# Patient Record
Sex: Male | Born: 1965 | Race: White | Hispanic: No | Smoking: Current every day smoker
Health system: Southern US, Community
[De-identification: ages and names within clinical notes are randomized; demographics above are authoritative.]

## PROBLEM LIST (undated history)

## (undated) DIAGNOSIS — C801 Malignant (primary) neoplasm, unspecified: Secondary | ICD-10-CM

## (undated) DIAGNOSIS — M549 Dorsalgia, unspecified: Secondary | ICD-10-CM

## (undated) DIAGNOSIS — F419 Anxiety disorder, unspecified: Secondary | ICD-10-CM

## (undated) DIAGNOSIS — K7460 Unspecified cirrhosis of liver: Secondary | ICD-10-CM

## (undated) DIAGNOSIS — F200 Paranoid schizophrenia: Secondary | ICD-10-CM

## (undated) DIAGNOSIS — K859 Acute pancreatitis without necrosis or infection, unspecified: Secondary | ICD-10-CM

## (undated) DIAGNOSIS — C229 Malignant neoplasm of liver, not specified as primary or secondary: Secondary | ICD-10-CM

## (undated) DIAGNOSIS — B1920 Unspecified viral hepatitis C without hepatic coma: Secondary | ICD-10-CM

## (undated) HISTORY — PX: TONSILLECTOMY: SUR1361

---

## 2006-01-14 ENCOUNTER — Emergency Department (HOSPITAL_COMMUNITY): Admission: EM | Admit: 2006-01-14 | Discharge: 2006-01-14 | Payer: Self-pay | Admitting: Family Medicine

## 2012-11-22 ENCOUNTER — Emergency Department (HOSPITAL_COMMUNITY)
Admission: EM | Admit: 2012-11-22 | Discharge: 2012-11-22 | Disposition: A | Discharge status: Home / Self Care | Payer: Self-pay | Attending: Emergency Medicine | Admitting: Emergency Medicine

## 2012-11-22 ENCOUNTER — Emergency Department (HOSPITAL_COMMUNITY): Payer: Self-pay

## 2012-11-22 ENCOUNTER — Encounter (HOSPITAL_COMMUNITY): Payer: Self-pay | Admitting: Internal Medicine

## 2012-11-22 DIAGNOSIS — R209 Unspecified disturbances of skin sensation: Secondary | ICD-10-CM | POA: Insufficient documentation

## 2012-11-22 DIAGNOSIS — I10 Essential (primary) hypertension: Secondary | ICD-10-CM | POA: Insufficient documentation

## 2012-11-22 DIAGNOSIS — M549 Dorsalgia, unspecified: Secondary | ICD-10-CM | POA: Insufficient documentation

## 2012-11-22 DIAGNOSIS — G8929 Other chronic pain: Secondary | ICD-10-CM | POA: Insufficient documentation

## 2012-11-22 DIAGNOSIS — R079 Chest pain, unspecified: Secondary | ICD-10-CM | POA: Insufficient documentation

## 2012-11-22 DIAGNOSIS — F17200 Nicotine dependence, unspecified, uncomplicated: Secondary | ICD-10-CM | POA: Insufficient documentation

## 2012-11-22 DIAGNOSIS — M79609 Pain in unspecified limb: Secondary | ICD-10-CM | POA: Insufficient documentation

## 2012-11-22 HISTORY — DX: Dorsalgia, unspecified: M54.9

## 2012-11-22 LAB — COMPREHENSIVE METABOLIC PANEL
Albumin: 3.5 g/dL (ref 3.5–5.2)
BUN: 9 mg/dL (ref 6–23)
Creatinine, Ser: 0.93 mg/dL (ref 0.50–1.35)
Potassium: 4.4 mEq/L (ref 3.5–5.1)
Total Protein: 7.3 g/dL (ref 6.0–8.3)

## 2012-11-22 LAB — D-DIMER, QUANTITATIVE: D-Dimer, Quant: 0.45 ug/mL-FEU (ref 0.00–0.48)

## 2012-11-22 LAB — POCT I-STAT TROPONIN I: Troponin i, poc: 0 ng/mL (ref 0.00–0.08)

## 2012-11-22 LAB — CBC WITH DIFFERENTIAL/PLATELET
Basophils Relative: 0 % (ref 0–1)
Eosinophils Absolute: 0.2 10*3/uL (ref 0.0–0.7)
Hemoglobin: 15 g/dL (ref 13.0–17.0)
MCH: 30.7 pg (ref 26.0–34.0)
MCHC: 34.6 g/dL (ref 30.0–36.0)
Monocytes Absolute: 1 10*3/uL (ref 0.1–1.0)
Monocytes Relative: 13 % — ABNORMAL HIGH (ref 3–12)
Neutrophils Relative %: 65 % (ref 43–77)
RDW: 14.1 % (ref 11.5–15.5)

## 2012-11-22 LAB — POCT I-STAT, CHEM 8
BUN: 8 mg/dL (ref 6–23)
Chloride: 105 mEq/L (ref 96–112)
Sodium: 137 mEq/L (ref 135–145)

## 2012-11-22 MED ORDER — HYDROMORPHONE HCL PF 1 MG/ML IJ SOLN
1.0000 mg | Freq: Once | INTRAMUSCULAR | Status: AC
  Administered 2012-11-22: 1 mg via INTRAVENOUS
  Filled 2012-11-22: qty 1

## 2012-11-22 MED ORDER — OXYCODONE-ACETAMINOPHEN 5-325 MG PO TABS: 1.0000 | ORAL_TABLET | ORAL | Status: DC | PRN

## 2012-11-22 MED ORDER — KETOROLAC TROMETHAMINE 30 MG/ML IJ SOLN: 30.0000 mg | Freq: Once | INTRAMUSCULAR | Status: DC

## 2012-11-22 MED ORDER — GADOBENATE DIMEGLUMINE 529 MG/ML IV SOLN: 20.0000 mL | Freq: Once | INTRAVENOUS | Status: DC

## 2012-11-22 MED ORDER — HYDROMORPHONE HCL PF 2 MG/ML IJ SOLN
2.0000 mg | Freq: Once | INTRAMUSCULAR | Status: AC
  Administered 2012-11-22: 2 mg via INTRAMUSCULAR
  Filled 2012-11-22: qty 1

## 2012-11-22 NOTE — ED Notes (Signed)
Iv nurse at the bedside

## 2012-11-22 NOTE — ED Notes (Signed)
IV team at bedside 

## 2012-11-22 NOTE — ED Notes (Signed)
Pt aware that urine sample is needed.  

## 2012-11-22 NOTE — ED Notes (Signed)
PT refused  Urine test.

## 2012-11-22 NOTE — ED Provider Notes (Signed)
History     CSN: [id number]  Arrival date & time 11/22/12  1347   First MD Initiated Contact with Patient 11/22/12 1347      Chief Complaint  Patient presents with  . Back Pain    (Consider location/radiation/quality/duration/timing/severity/associated sxs/prior treatment) HPI Comments: Rodney Reed is a 47 year old male white male with PMH of chronic back pain and claims to have a ruptured or slipped disc presenting with 3 days of worsening severe back pain.  He was just seen at [facility] for similar complaints and was discharged with referral to specialist and given prescriptions for flexeril, prednisone, and toradol that he did not get filled.  He claims he has taken all three medicines in the past to no relief.  He also claims he has been evaluated by Dr. Lucius Conn from sports medicine in the past and was told of a slipped disc requiring surgery but he cannot afford it. He denies any recent trauma but claims he might have twisted wrong which has set off his pain.  He endorses numbness and tingling in his right toes, right chest pain and shooting pain in left arm earlier that has since then resolved.  He denies any shortness of breath, N/V/D, fever, chills, abdominal pain, or any urinary complaints at this time.  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  He smokes 1 pack per day and drank 2 beers yesterday.  He says he can barely walk or move to the severity of his pain.  The pain is diffuse back pain with point tenderness in mid thoracic and lower lumbar region, exacerbated with any movement, no relieving factors, no relief with vicodin in ED yesterday, worsening in severity, currently 10+, with initial radiation in right chest and shooting pain in left arm that has since then resolved.    Per narcotic database: last filled Vicodin 3 day supply March 30th, 2014 and prior opiate prescriptions of variable strength in January   Patient is a 47 y.o. male  presenting with back pain. The history is provided by the patient. No language interpreter was used.  Back Pain Location:  Generalized Quality:  Stabbing and shooting Radiates to:  L shoulder, R thigh and R foot Pain severity:  Severe Pain is:  Same all the time Onset quality:  Sudden Duration:  3 days Timing:  Constant Progression:  Worsening Chronicity:  Recurrent Context: not falling and not lifting heavy objects   Relieved by:  Nothing Worsened by:  Movement Ineffective treatments:  NSAIDs, narcotics, being still and bed rest Associated symptoms: chest pain, leg pain, numbness, paresthesias and tingling   Associated symptoms comment:  Right sided chest pain, left arm initially with shooting pain Risk factors comment:  Drug abuse, hx of heroin use   Past Medical History  Diagnosis Date  . Back pain     History reviewed. No pertinent past surgical history.  History reviewed. No pertinent family history.  History  Substance Use Topics  . Smoking status: Current Every Day Smoker -- 1.00 packs/day  . Smokeless tobacco: Not on file  . Alcohol Use: 1.2 oz/week    2 Cans of beer per week     Comment: last use 11/21/12    Review of Systems  Constitutional: Negative.   HENT: Negative.   Eyes: Negative.   Respiratory: Negative.   Cardiovascular: Positive for chest pain.  Gastrointestinal: Negative.   Endocrine: Negative.   Genitourinary: Negative.   Musculoskeletal:  Positive for back pain.  Skin: Negative.   Allergic/Immunologic: Negative.   Neurological: Positive for tingling, numbness and paresthesias.       Paresthesias right toes  Hematological: Negative.   Psychiatric/Behavioral: Negative.     Allergies  Review of patient's allergies indicates no known allergies.  Home Medications   Current Outpatient Rx  Name  Route  Sig  Dispense  Refill  . oxyCODONE-acetaminophen (PERCOCET/ROXICET) 5-325 MG per tablet   Oral   Take 1 tablet by mouth every 4 (four)  hours as needed for pain.   15 tablet   0     BP 150/80  Pulse 88  Temp(Src) 98.7 F (37.1 C) (Oral)  Resp 20  SpO2 97%  Physical Exam  Constitutional: He is oriented to person, place, and time. He appears well-developed and well-nourished.  Acute distress due to back pain  HENT:  Head: Normocephalic and atraumatic.  Eyes: EOM are normal. Pupils are equal, round, and reactive to light.  Neck: Normal range of motion. Neck supple.  Cardiovascular: Normal rate, regular rhythm, normal heart sounds and intact distal pulses.   Equal pulses b/l  Pulmonary/Chest: Effort normal and breath sounds normal.  Abdominal: Soft. Bowel sounds are normal. There is no tenderness.  Musculoskeletal: He exhibits tenderness. He exhibits no edema.  Diffuse tenderness to palpation of back, point tenderness mid thoracic spine and lower lumbar at midline.  Extreme complaints of pain with any movement.  Minimal ability to raise b/l legs due to pain.   Neurological: He is alert and oriented to person, place, and time.  Strength equal b/l upper and lower extremities.  Sensation grossly intact  Skin: Skin is warm and dry. There is erythema.  Psychiatric: His behavior is normal.  In excruciating pain    ED Course  Procedures (including critical care time)  Labs Reviewed  CBC WITH DIFFERENTIAL - Abnormal; Notable for the following:    Platelets 80 (*)    Monocytes Relative 13 (*)    All other components within normal limits  COMPREHENSIVE METABOLIC PANEL - Abnormal; Notable for the following:    Glucose, Bld 104 (*)    AST 57 (*)    ALT 60 (*)    All other components within normal limits  POCT I-STAT, CHEM 8 - Abnormal; Notable for the following:    Glucose, Bld 102 (*)    All other components within normal limits  D-DIMER, QUANTITATIVE  POCT I-STAT TROPONIN I   Rodney Reed  11/22/2012   *RADIOLOGY REPORT*  Clinical Data:  Fevers and sweats.  Severe back pain.  MRI THORACIC AND  LUMBAR SPINE WITHOUT Reed  Technique:  Multiplanar and multiecho pulse sequences of the thoracic and lumbar spine were obtained without intravenous Reed.  Comparison:  Chest x-ray 11/22/2012.  MRI THORACIC SPINE  Findings: Normal signals present in the thoracic spinal cord. Schmorl's nodes are present at multiple disc levels from T5-6 through T11-12.  There is some chronic loss of vertebral body height at T7-T10 with exaggerated thoracic kyphosis.  Slight disc bulges are present at T5-6, T6-7, and T7-8 without significant stenoses.  The foramina are patent bilaterally.  The soft tissues are unremarkable.  Disc signal is within normal limits.  IMPRESSION:  1.  Mild degenerative changes of the thoracic spine. 2.  Slight exaggerated kyphosis. 3.  No acute or focal abnormality to explain the patient's symptoms.  MRI LUMBAR SPINE  Findings: Normal signal is present in the conus medullaris which terminates  at T12-L1.  Marrow signal, vertebral body heights, and alignment are normal.  Slight leftward curvature of the lumbar spine is centered at L2-3.  Relatively short pedicles are present.  L1-2:  Mild facet hypertrophy is present.  Slight disc bulging is noted.  There is no significant stenosis.  L2-3:  Mild facet hypertrophy is present bilaterally.  Slight disc bulging is evident.  No significant stenosis is present.  L3-4:  A mild broad-based disc herniation is present.  Mild facet hypertrophy is noted bilaterally.  Mild foraminal narrowing is present on the left.  L4-5:  A broad-based disc herniation is asymmetric to the right. Mild facet hypertrophy is noted.  Mild lateral recess and foraminal narrowing is present bilaterally.  L5-S1:  Mild facet hypertrophy is present.  There is no significant stenosis.  IMPRESSION:  1.  Congenital and acquired stenosis as described. 2.  The most severe level is L4-5 with mild bilateral lateral recess and foraminal stenosis, worse on the right. 3.  Mild left foraminal stenosis  at L3-4. 4.  Multilevel facet hypertrophy as described.   Original Report Authenticated By: Marin Roberts, M.D.   Rodney Reed  11/22/2012   *RADIOLOGY REPORT*  Clinical Data:  Fevers and sweats.  Severe back pain.  MRI THORACIC AND LUMBAR SPINE WITHOUT Reed  Technique:  Multiplanar and multiecho pulse sequences of the thoracic and lumbar spine were obtained without intravenous Reed.  Comparison:  Chest x-ray 11/22/2012.  MRI THORACIC SPINE  Findings: Normal signals present in the thoracic spinal cord. Schmorl's nodes are present at multiple disc levels from T5-6 through T11-12.  There is some chronic loss of vertebral body height at T7-T10 with exaggerated thoracic kyphosis.  Slight disc bulges are present at T5-6, T6-7, and T7-8 without significant stenoses.  The foramina are patent bilaterally.  The soft tissues are unremarkable.  Disc signal is within normal limits.  IMPRESSION:  1.  Mild degenerative changes of the thoracic spine. 2.  Slight exaggerated kyphosis. 3.  No acute or focal abnormality to explain the patient's symptoms.  MRI LUMBAR SPINE  Findings: Normal signal is present in the conus medullaris which terminates at T12-L1.  Marrow signal, vertebral body heights, and alignment are normal.  Slight leftward curvature of the lumbar spine is centered at L2-3.  Relatively short pedicles are present.  L1-2:  Mild facet hypertrophy is present.  Slight disc bulging is noted.  There is no significant stenosis.  L2-3:  Mild facet hypertrophy is present bilaterally.  Slight disc bulging is evident.  No significant stenosis is present.  L3-4:  A mild broad-based disc herniation is present.  Mild facet hypertrophy is noted bilaterally.  Mild foraminal narrowing is present on the left.  L4-5:  A broad-based disc herniation is asymmetric to the right. Mild facet hypertrophy is noted.  Mild lateral recess and foraminal narrowing is present bilaterally.  L5-S1:  Mild facet hypertrophy  is present.  There is no significant stenosis.  IMPRESSION:  1.  Congenital and acquired stenosis as described. 2.  The most severe level is L4-5 with mild bilateral lateral recess and foraminal stenosis, worse on the right. 3.  Mild left foraminal stenosis at L3-4. 4.  Multilevel facet hypertrophy as described.   Original Report Authenticated By: Marin Roberts, M.D.   Dg Chest Portable 1 View  11/22/2012   *RADIOLOGY REPORT*  Clinical Data: Numbness and tingling.  PORTABLE CHEST - 1 VIEW  Comparison: 04/23/2010  Findings: Patient rotated minimally left. Midline  trachea.  Normal heart size for level of inspiration.  Mild left hemidiaphragm elevation which is mildly increased since the 2011 study.  No right and no definite left pleural effusion. No pneumothorax.  Mildly low lung volumes.  Volume loss at the left lung base with overlying pulmonary opacity; likely atelectasis.  IMPRESSION: Diminished lung volumes with increased mild left hemidiaphragm elevation.  No convincing evidence of acute disease.  If there is a clinical concern of left lower lobe acute process, consider PA and lateral radiographs.   Original Report Authenticated By: Jeronimo Greaves, M.D.    1. Back pain   2. HTN (hypertension)      Date: 11/22/2012  Rate: 81bpm  Rhythm: normal sinus rhythm  QRS Axis: normal  Intervals: normal  ST/T Wave abnormalities: normal  Conduction Disutrbances:none  Narrative Interpretation: NSR 81bpm, ? Left atrial abnormality  Old EKG Reviewed: none available  MDM  Rodney Reed is a 47 year old male with chronic back pain presenting with worsening diffuse back pain x3 days. Pain is 10+, hypertensive, with hx of IVDA and other illicit drug use.  Concern for possible spinal abscess given degree of pain and drug abuse hx vs. Disc herniation vs. Aortic dissection vs. ACS among broad differentials.   -CBC: no leukocytosis, Hb 15 -CMET: mild transaminitis: AST 57 ALT 60 -Troponin x1 negative  -EKG:  NST with no ST or T wave changes suggestive of ischemia -B/L blood pressure: R 129/94 L 141/84  -MRI thoracic and lumbar spine w/wo Reed: mild degenerative changes of thoracic spine, slight exaggerated kyphosis, no acute or focal abnormality.  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  -u/a--unable to be collected -UDS--unable to be collected -D-Dimer: 0.45 -Dilaudid 2mg  IM x1  Case discussed with Dr. Manus Gunning and patient was discharged by Dr. Bebe Shaggy.  Please refer to their notes.          Darden Palmer, MD 11/23/12 [phone]

## 2012-11-22 NOTE — ED Notes (Signed)
Per [facility], pt was seen at hospital yesterday for same. Back pain for 3 days and was referred to specialist but has not followed up.

## 2012-11-22 NOTE — ED Notes (Signed)
Pt speaking to ED-P in harsh, rapid, pressured tone. Reports he was seen in ED at [facility] yesterday and given rx for prednisone, toradol and flexeril that he did not get filled "because it doesn't help" "I need pain medicine". Reports he has seen a "back doctor" that told him he needs surgery and that he also has been referred to pain clinic.

## 2012-11-22 NOTE — ED Provider Notes (Signed)
Seen with resident. 3 days of lower back pain, worse with movement, radiating down R leg with tingling in toes.  Hx ruptured disc, not yet seen specialist.  Seen at [facility] yesterday where "nothing" was done.. Denies chest pain, SOB, abdominal pain. No incontinence, vomiting, fever.  Remote history of IVDA.  TTP throughout lumbar spine and paraspinal muscles.  TTP about T7. Equal grip strengths. Equal peripheral pulses, equal bilateral upper extremity blood pressures. 5/5 strength in bilateral lower extremities. Ankle plantar and dorsiflexion intact. Great toe extension intact bilaterally. +2 DP and PT pulses. +2 patellar reflexes bilaterally. Normal gait. BP 150/80  Pulse 88  Temp(Src) 98.7 F (37.1 C) (Oral)  Resp 20  SpO2 97%    Glynn Octave, MD 11/22/12 2104

## 2012-11-22 NOTE — ED Notes (Signed)
Unable to gain IV access by RN x2 and EMS x2. ED-P notified and IV team paged.

## 2012-11-22 NOTE — ED Provider Notes (Signed)
I assumed care in signout to f/u on MR imaging Imaging results reviewed I spoke to dr Newell Coral, on for neurosurgery He reviewed imaging and he does not need admission or neurosurgery followup Pt is able to stand up and ambulate unassisted when I am in the room Distal pulses are intact.  He is able to dorsi/plantar flex both feet He has no abdominal tenderness He does not mention any recurrent chest pain, EKG reviewed and unremarkable Advised need for f/u for his back pain and his HTN He may need f/u with pain clinic as well   Joya Gaskins, MD 11/22/12 1926

## 2012-11-23 NOTE — ED Provider Notes (Signed)
I saw and evaluated the patient, reviewed the resident's note and I agree with the findings and plan. If applicable, I agree with the resident's interpretation of the EKG.  If applicable, I was present for critical portions of any procedures performed.   See my additional note  Glynn Octave, MD 11/23/12 [phone]

## 2012-12-04 ENCOUNTER — Emergency Department (HOSPITAL_COMMUNITY)
Admission: EM | Admit: 2012-12-04 | Discharge: 2012-12-04 | Disposition: A | Discharge status: Home / Self Care | Payer: Self-pay | Attending: Emergency Medicine | Admitting: Emergency Medicine

## 2012-12-04 ENCOUNTER — Encounter (HOSPITAL_COMMUNITY): Payer: Self-pay

## 2012-12-04 ENCOUNTER — Emergency Department (HOSPITAL_COMMUNITY): Payer: Self-pay

## 2012-12-04 DIAGNOSIS — R000 Tachycardia, unspecified: Secondary | ICD-10-CM | POA: Insufficient documentation

## 2012-12-04 DIAGNOSIS — R109 Unspecified abdominal pain: Secondary | ICD-10-CM | POA: Insufficient documentation

## 2012-12-04 DIAGNOSIS — M549 Dorsalgia, unspecified: Secondary | ICD-10-CM

## 2012-12-04 DIAGNOSIS — Y92410 Unspecified street and highway as the place of occurrence of the external cause: Secondary | ICD-10-CM | POA: Insufficient documentation

## 2012-12-04 DIAGNOSIS — Y998 Other external cause status: Secondary | ICD-10-CM | POA: Insufficient documentation

## 2012-12-04 DIAGNOSIS — F17200 Nicotine dependence, unspecified, uncomplicated: Secondary | ICD-10-CM | POA: Insufficient documentation

## 2012-12-04 DIAGNOSIS — N390 Urinary tract infection, site not specified: Secondary | ICD-10-CM

## 2012-12-04 DIAGNOSIS — K769 Liver disease, unspecified: Secondary | ICD-10-CM

## 2012-12-04 DIAGNOSIS — R300 Dysuria: Secondary | ICD-10-CM | POA: Insufficient documentation

## 2012-12-04 DIAGNOSIS — G8929 Other chronic pain: Secondary | ICD-10-CM | POA: Insufficient documentation

## 2012-12-04 LAB — CBC WITH DIFFERENTIAL/PLATELET
Basophils Absolute: 0.1 10*3/uL (ref 0.0–0.1)
Basophils Relative: 0 % (ref 0–1)
Eosinophils Relative: 2 % (ref 0–5)
Lymphocytes Relative: 11 % — ABNORMAL LOW (ref 12–46)
MCHC: 36 g/dL (ref 30.0–36.0)
MCV: 87.2 fL (ref 78.0–100.0)
Monocytes Absolute: 1.3 10*3/uL — ABNORMAL HIGH (ref 0.1–1.0)
Platelets: 188 10*3/uL (ref 150–400)
RDW: 13.3 % (ref 11.5–15.5)
WBC: 16.4 10*3/uL — ABNORMAL HIGH (ref 4.0–10.5)

## 2012-12-04 LAB — POCT I-STAT, CHEM 8
Calcium, Ion: 1.14 mmol/L (ref 1.12–1.23)
Chloride: 110 mEq/L (ref 96–112)
Glucose, Bld: 96 mg/dL (ref 70–99)
HCT: 52 % (ref 39.0–52.0)
Hemoglobin: 17.7 g/dL — ABNORMAL HIGH (ref 13.0–17.0)
TCO2: 23 mmol/L (ref 0–100)

## 2012-12-04 LAB — URINALYSIS, ROUTINE W REFLEX MICROSCOPIC
Ketones, ur: 15 mg/dL — AB
Nitrite: NEGATIVE
Protein, ur: NEGATIVE mg/dL
Urobilinogen, UA: 2 mg/dL — ABNORMAL HIGH (ref 0.0–1.0)
pH: 5.5 (ref 5.0–8.0)

## 2012-12-04 LAB — URINE MICROSCOPIC-ADD ON

## 2012-12-04 MED ORDER — CIPROFLOXACIN HCL 500 MG PO TABS
500.0000 mg | ORAL_TABLET | Freq: Once | ORAL | Status: AC
  Administered 2012-12-04: 500 mg via ORAL
  Filled 2012-12-04: qty 1

## 2012-12-04 MED ORDER — IOHEXOL 300 MG/ML  SOLN: 25.0000 mL | INTRAMUSCULAR | Status: AC

## 2012-12-04 MED ORDER — HYDROMORPHONE HCL PF 1 MG/ML IJ SOLN
1.0000 mg | Freq: Once | INTRAMUSCULAR | Status: AC
  Administered 2012-12-04: 1 mg via INTRAVENOUS
  Filled 2012-12-04: qty 1

## 2012-12-04 MED ORDER — OXYCODONE-ACETAMINOPHEN 5-325 MG PO TABS
1.0000 | ORAL_TABLET | Freq: Once | ORAL | Status: AC
  Administered 2012-12-04: 1 via ORAL
  Filled 2012-12-04: qty 1

## 2012-12-04 MED ORDER — CIPROFLOXACIN HCL 500 MG PO TABS: 500.0000 mg | ORAL_TABLET | Freq: Two times a day (BID) | ORAL | Status: DC

## 2012-12-04 MED ORDER — DIAZEPAM 5 MG PO TABS
10.0000 mg | ORAL_TABLET | Freq: Once | ORAL | Status: AC
  Administered 2012-12-04: 10 mg via ORAL
  Filled 2012-12-04: qty 2

## 2012-12-04 MED ORDER — SODIUM CHLORIDE 0.9 % IV SOLN
Freq: Once | INTRAVENOUS | Status: AC
  Administered 2012-12-04: 18:00:00 via INTRAVENOUS

## 2012-12-04 MED ORDER — IOHEXOL 300 MG/ML  SOLN
120.0000 mL | Freq: Once | INTRAMUSCULAR | Status: AC | PRN
  Administered 2012-12-04: 120 mL via INTRAVENOUS

## 2012-12-04 MED ORDER — OXYCODONE-ACETAMINOPHEN 5-325 MG PO TABS: 1.0000 | ORAL_TABLET | Freq: Four times a day (QID) | ORAL | Status: DC | PRN

## 2012-12-04 MED ORDER — ONDANSETRON HCL 4 MG/2ML IJ SOLN
4.0000 mg | Freq: Once | INTRAMUSCULAR | Status: AC
  Administered 2012-12-04: 4 mg via INTRAVENOUS
  Filled 2012-12-04: qty 2

## 2012-12-04 NOTE — ED Notes (Signed)
CT contacted about wait time. Per CT pt is next to be seen. Pt made aware. Pt reports increase in pain. MD Plunkett made aware.
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
## 2012-12-04 NOTE — ED Notes (Signed)
CT contacted about making disc of pt's results for him.

## 2012-12-04 NOTE — ED Notes (Signed)
Rodney Reed in radiology called and states that pt does not need oral contrast.

## 2013-04-14 ENCOUNTER — Emergency Department (HOSPITAL_COMMUNITY)
Admission: EM | Admit: 2013-04-14 | Discharge: 2013-04-15 | Disposition: A | Discharge status: Home / Self Care | Payer: Medicaid Other | Attending: Emergency Medicine | Admitting: Emergency Medicine

## 2013-04-14 ENCOUNTER — Emergency Department (HOSPITAL_COMMUNITY): Payer: Medicaid Other

## 2013-04-14 ENCOUNTER — Encounter (HOSPITAL_COMMUNITY): Payer: Self-pay | Admitting: *Deleted

## 2013-04-14 DIAGNOSIS — R269 Unspecified abnormalities of gait and mobility: Secondary | ICD-10-CM | POA: Insufficient documentation

## 2013-04-14 DIAGNOSIS — IMO0002 Reserved for concepts with insufficient information to code with codable children: Secondary | ICD-10-CM | POA: Insufficient documentation

## 2013-04-14 DIAGNOSIS — Y93E6 Activity, residential relocation: Secondary | ICD-10-CM | POA: Insufficient documentation

## 2013-04-14 DIAGNOSIS — F17200 Nicotine dependence, unspecified, uncomplicated: Secondary | ICD-10-CM | POA: Insufficient documentation

## 2013-04-14 DIAGNOSIS — Y929 Unspecified place or not applicable: Secondary | ICD-10-CM | POA: Insufficient documentation

## 2013-04-14 DIAGNOSIS — M549 Dorsalgia, unspecified: Secondary | ICD-10-CM

## 2013-04-14 DIAGNOSIS — W108XXA Fall (on) (from) other stairs and steps, initial encounter: Secondary | ICD-10-CM | POA: Insufficient documentation

## 2013-04-14 MED ORDER — ONDANSETRON 4 MG PO TBDP
8.0000 mg | ORAL_TABLET | Freq: Once | ORAL | Status: AC
  Administered 2013-04-14: 8 mg via ORAL
  Filled 2013-04-14: qty 2

## 2013-04-14 MED ORDER — OXYCODONE-ACETAMINOPHEN 5-325 MG PO TABS
2.0000 | ORAL_TABLET | Freq: Once | ORAL | Status: AC
  Administered 2013-04-14: 2 via ORAL
  Filled 2013-04-14: qty 2

## 2013-04-14 NOTE — ED Notes (Signed)
Pt states he was carrying a box down some stairs and missed the step and fell onto his left arm, and slid down the last two steps on his back. Pt states he has back issues, and is having a shooting burning sensation down the center of the back, around his abd, and the upper part of his back to his left shoulder. Pt states he is also having some numbness and tingling to his toes. Pt states he moved recently and moved back and was unable to see his regular doctor. Pt rates pain 8/10. Pt states he ran out of his pain medication prescription and has been unable to get it filled due to switching doctors and moving.

## 2013-04-14 NOTE — ED Notes (Signed)
Pt states carrying box from moving and fell down 5 steps. Pt landed on his left shoulder and then his back. Pt has back problems to begin with, pt ambulatory.

## 2013-04-14 NOTE — ED Provider Notes (Signed)
CSN: [id number]     Arrival date & time 04/14/13  1921 History  This chart was scribed for non-physician practitioner, Mora Bellman, PA-C,working with Hurman Horn, MD, by Karle Plumber, ED Scribe.  This patient was seen in room TR11C/TR11C and the patient's care was started at 9:17 PM.    Chief Complaint  Patient presents with  . Fall   The history is provided by the patient. No language interpreter was used.   HPI Comments:  Rodney Reed is a 47 y.o. male with h/o back problems who presents to the Emergency Department complaining of severe, sharp, shooting right-sided back pain that radiates down to his lower back onset earlier today. He states he was moving some things when he missed a step and fell down the remaining 3-4 stairs. He reports landing on his left shoulder and twisted rolling to his back. He has a long history of chronic back pain and is managed in the community. Pt states he feels tingling in his toes and "needles" in his feet. He denies h/o cancer or h/o IV drug use. Pt denies bowel or bladder incontinence, head injury or LOC. No fevers, chills, nausea, vomiting, abdominal pain.    Past Medical History  Diagnosis Date  . Back pain    History reviewed. No pertinent past surgical history. History reviewed. No pertinent family history. History  Substance Use Topics  . Smoking status: Current Every Day Smoker -- 1.00 packs/day  . Smokeless tobacco: Not on file  . Alcohol Use: 1.2 oz/week    2 Cans of beer per week     Comment: last use 11/21/12    Review of Systems  Constitutional: Negative for fever and chills.  Respiratory: Negative for shortness of breath.   Gastrointestinal:       Denies bowel incontinence.  Genitourinary:       Denies bladder incontinence.  Musculoskeletal: Positive for back pain, arthralgias and gait problem.  Neurological: Negative for syncope.  All other systems reviewed and are negative.   Allergies  Review of patient's  allergies indicates no known allergies.  Home Medications   Current Outpatient Rx  Name  Route  Sig  Dispense  Refill  . ciprofloxacin (CIPRO) 500 MG tablet   Oral   Take 1 tablet (500 mg total) by mouth every 12 (twelve) hours.   14 tablet   0   . oxyCODONE-acetaminophen (PERCOCET/ROXICET) 5-325 MG per tablet   Oral   Take 1 tablet by mouth every 4 (four) hours as needed for pain.   15 tablet   0   . oxyCODONE-acetaminophen (PERCOCET/ROXICET) 5-325 MG per tablet   Oral   Take 1-2 tablets by mouth every 6 (six) hours as needed for pain.   30 tablet   0    Triage Vitals: BP 134/68  Pulse 104  Temp(Src) 97.7 F (36.5 C) (Oral)  Resp 20  Wt 209 lb 7 oz (95 kg)  SpO2 100% Physical Exam  Nursing note and vitals reviewed. Constitutional: He is oriented to person, place, and time. He appears well-developed and well-nourished. He appears distressed.  HENT:  Head: Normocephalic and atraumatic.  Right Ear: External ear normal.  Left Ear: External ear normal.  Nose: Nose normal.  Eyes: Conjunctivae are normal.  Neck: Normal range of motion. No tracheal deviation present.  Cardiovascular: Normal rate, regular rhythm, normal heart sounds, intact distal pulses and normal pulses.   Cap refill < 3 seconds  Pulmonary/Chest: Effort normal and breath sounds normal.  No stridor.  Abdominal: Soft. He exhibits no distension. There is no tenderness.  Musculoskeletal: Normal range of motion.  Tender to palpation over cervical, thoracic, lumbar spine.   Neurological: He is alert and oriented to person, place, and time. He has normal strength. No sensory deficit. Gait abnormal. Coordination normal.  Antalgic, not ataxic gait. Strength 5/5 in all extremities. Sensation intact in feet bilaterally.   Skin: Skin is warm and dry. He is not diaphoretic.  Psychiatric: He has a normal mood and affect. His behavior is normal.    ED Course  Procedures (including critical care time) DIAGNOSTIC  STUDIES: Oxygen Saturation is 100% on RA, normal by my interpretation.   COORDINATION OF CARE: 9:23 PM- Will obtain X-Rays and give pain medication. Pt verbalizes understanding and agrees to plan.  Medications - No data to display  Labs Review Labs Reviewed - No data to display Imaging Review Dg Cervical Spine Complete  04/14/2013   *RADIOLOGY REPORT*  Clinical Data: Status post fall down stairs; neck pain.  CERVICAL SPINE - COMPLETE 4+ VIEW  Comparison: Cervical spine radiographs performed 11/13/2010, and MRI of the cervical spine performed 08/01/2012  Findings: There is no evidence of fracture or subluxation. Vertebral bodies demonstrate normal height and alignment. Intervertebral disc spaces are preserved.  Prevertebral soft tissues are within normal limits.  The provided odontoid view demonstrates no significant abnormality.  The visualized lung apices are clear.  IMPRESSION: No evidence of fracture or subluxation along the cervical spine.   Original Report Authenticated By: Tonia Ghent, M.D.   Dg Thoracic Spine 2 View  04/14/2013   *RADIOLOGY REPORT*  Clinical Data: Status post fall down stairs; upper back pain.  THORACIC SPINE - 2 VIEW  Comparison: Thoracic spine MRI performed 11/22/2012  Findings: There is no evidence of fracture or subluxation. Vertebral bodies demonstrate normal height and alignment. Intervertebral disc spaces are preserved.  The visualized portions of both lungs are clear.  The mediastinum is unremarkable in appearance.  IMPRESSION: No evidence of fracture or subluxation along the thoracic spine.   Original Report Authenticated By: Tonia Ghent, M.D.   Dg Lumbar Spine Complete  04/14/2013   *RADIOLOGY REPORT*  Clinical Data: Status post fall down stairs; lower back pain.  LUMBAR SPINE - COMPLETE 4+ VIEW  Comparison: MRI of the lumbar spine performed 11/22/2012  Findings: There is mild apparent new endplate irregularity involving the vertebral endplates at T12-L1.   Would correlate for any clinical evidence of mild discitis; alternatively, this could reflect degenerative change.  There is no evidence of acute fracture or subluxation along the lumbar spine.  Visualized vertebral spaces are otherwise preserved. The visualized bowel gas pattern is grossly unremarkable.  The sacroiliac joints are within normal limits.  IMPRESSION:  1.  No evidence of acute fracture or subluxation along the lumbar spine. 2.  Mild apparent new endplate irregularity involving the vertebral endplate at T12-L1.  Would correlate for any clinical evidence of discitis; alternatively, this could reflect new degenerative change.   Original Report Authenticated By: Tonia Ghent, M.D.   Ct Lumbar Spine Wo Contrast  04/15/2013   *RADIOLOGY REPORT*  Clinical Data: Fall, back pain.  CT LUMBAR SPINE WITHOUT CONTRAST  Technique:  Multidetector CT imaging of the lumbar spine was performed without intravenous contrast administration. Multiplanar CT image reconstructions were also generated.  Comparison: Lumbar spine radiograph April 14, 2013, CT of the abdomen pelvis March 16, 2011.  Findings: Lumbar vertebra post elements are intact and aligned with maintenance of  the lumbar lordosis.  Using the reference level of the last well formed intervertebral disc as L5 S1, moderate to severe T12-L1 disc height loss, with endplate sclerosis, the cystic degenerative change and marginal spurring, markedly progressed from prior CT. Mild broad levoscoliosis could be positional.  The included prevertebral and paraspinal soft tissues are not suspicious.  Mild calcific atherosclerosis of the great vessels.  Level by level evaluation:  T12-L1:  Minimal annular bulging with ventral spurring. No canal stenosis or neural foraminal narrowing.  L1-2:  No significant disc bulge, canal stenosis or neural foraminal narrowing.  L2-3:  Mild annular bulging mild facet arthropathy with mild bilateral neural foraminal narrowing.  L3-4:   Small broad-based disc bulge, mild facet arthropathy and ligamentum flavum redundancy with minimal canal stenosis, and moderate bilateral neural foraminal narrowing.  L4-5:  Moderate broad-based disc bulge, moderate facet arthropathy and ligamentum flavum redundancy with mild canal stenosis. Moderate right and moderate to severe left neural foraminal narrowing.  L5 S1 transitional anatomy, the bilateral transverse processes articulate with the sacrum.  Mild to moderate facet arthropathy without disc bulge, canal stenosis or neural foraminal narrowing. IMPRESSION: No acute fracture nor malalignment.  Marked interval progression T12-L1 degenerative change, as unclear whether this could reflect a component of treated discitis osteomyelitis.  Findings may be better assessed on MRI of the lumbar spine as clinically indicated, on a non- emergent basis.  Transitional anatomy, sacralized five vertebral body.  Mild canal stenosis L4-5.  Neural foraminal narrowing L2-3 through L4-5: Moderate to severe on the left at L4-5.   Original Report Authenticated By: Awilda Metro    MDM  No diagnosis found.  Patient presents with chronic back pain after a fall today. No new neurological changes. Afebrile. Patient can walk, but states it is painful. No bowel or bladder incontinence, history of cancer, current drug abuse. Reading on lumbar spine film shows a question of new endplate irregularity involving vertebral endplate at T12-L1. A CT scan was done of the lumbar spine with focus on T12-L1. Scan showed marked interval progression T12-L1 degenerative change. Findings may be better assessed on MRI of the lumbar spine on a nonemergent basis. This report will be printed out for the patient. He will followup with his outpatient doctor who is managing his back pain. Return instructions given. Vital signs stable for discharge. I have discussed this case with Dr. Fonnie Jarvis who agrees with plan. Patient / Family / Caregiver informed of  clinical course, understand medical decision-making process, and agree with plan.   I personally performed the services described in this documentation, which was scribed in my presence. The recorded information has been reviewed and is accurate.     Mora Bellman, PA-C 04/15/13 0020

## 2013-04-15 MED ORDER — OXYCODONE-ACETAMINOPHEN 5-325 MG PO TABS: 2.0000 | ORAL_TABLET | Freq: Four times a day (QID) | ORAL | Status: DC | PRN

## 2013-04-15 MED ORDER — PROMETHAZINE HCL 25 MG PO TABS: 25.0000 mg | ORAL_TABLET | Freq: Four times a day (QID) | ORAL | Status: DC | PRN

## 2013-04-15 NOTE — ED Provider Notes (Signed)
Medical screening examination/treatment/procedure(s) were performed by non-physician practitioner and as supervising physician I was immediately available for consultation/collaboration.   Shatima Zalar M Dejour Vos, MD 04/15/13 1235 

## 2013-07-16 ENCOUNTER — Emergency Department (HOSPITAL_COMMUNITY): Payer: Medicaid Other

## 2013-07-16 ENCOUNTER — Emergency Department (HOSPITAL_COMMUNITY)
Admission: EM | Admit: 2013-07-16 | Discharge: 2013-07-16 | Disposition: A | Discharge status: Home / Self Care | Payer: Medicaid Other | Attending: Emergency Medicine | Admitting: Emergency Medicine

## 2013-07-16 ENCOUNTER — Encounter (HOSPITAL_COMMUNITY): Payer: Self-pay | Admitting: Emergency Medicine

## 2013-07-16 DIAGNOSIS — R0602 Shortness of breath: Secondary | ICD-10-CM | POA: Insufficient documentation

## 2013-07-16 DIAGNOSIS — Z859 Personal history of malignant neoplasm, unspecified: Secondary | ICD-10-CM | POA: Insufficient documentation

## 2013-07-16 DIAGNOSIS — J90 Pleural effusion, not elsewhere classified: Secondary | ICD-10-CM | POA: Insufficient documentation

## 2013-07-16 DIAGNOSIS — G8929 Other chronic pain: Secondary | ICD-10-CM | POA: Insufficient documentation

## 2013-07-16 DIAGNOSIS — R161 Splenomegaly, not elsewhere classified: Secondary | ICD-10-CM | POA: Insufficient documentation

## 2013-07-16 DIAGNOSIS — K7460 Unspecified cirrhosis of liver: Secondary | ICD-10-CM | POA: Insufficient documentation

## 2013-07-16 DIAGNOSIS — R61 Generalized hyperhidrosis: Secondary | ICD-10-CM | POA: Insufficient documentation

## 2013-07-16 DIAGNOSIS — R0789 Other chest pain: Secondary | ICD-10-CM | POA: Insufficient documentation

## 2013-07-16 DIAGNOSIS — M6281 Muscle weakness (generalized): Secondary | ICD-10-CM | POA: Insufficient documentation

## 2013-07-16 DIAGNOSIS — F17200 Nicotine dependence, unspecified, uncomplicated: Secondary | ICD-10-CM | POA: Insufficient documentation

## 2013-07-16 DIAGNOSIS — M549 Dorsalgia, unspecified: Secondary | ICD-10-CM | POA: Insufficient documentation

## 2013-07-16 DIAGNOSIS — Z79899 Other long term (current) drug therapy: Secondary | ICD-10-CM | POA: Insufficient documentation

## 2013-07-16 DIAGNOSIS — M542 Cervicalgia: Secondary | ICD-10-CM | POA: Insufficient documentation

## 2013-07-16 DIAGNOSIS — R209 Unspecified disturbances of skin sensation: Secondary | ICD-10-CM | POA: Insufficient documentation

## 2013-07-16 HISTORY — DX: Malignant (primary) neoplasm, unspecified: C80.1

## 2013-07-16 HISTORY — DX: Unspecified cirrhosis of liver: K74.60

## 2013-07-16 LAB — URINALYSIS, ROUTINE W REFLEX MICROSCOPIC
BILIRUBIN URINE: NEGATIVE
GLUCOSE, UA: NEGATIVE mg/dL
HGB URINE DIPSTICK: NEGATIVE
KETONES UR: NEGATIVE mg/dL
LEUKOCYTES UA: NEGATIVE
Nitrite: NEGATIVE
PROTEIN: NEGATIVE mg/dL
Specific Gravity, Urine: 1.018 (ref 1.005–1.030)
Urobilinogen, UA: 0.2 mg/dL (ref 0.0–1.0)
pH: 6 (ref 5.0–8.0)

## 2013-07-16 LAB — CBC WITH DIFFERENTIAL/PLATELET
BASOS PCT: 1 % (ref 0–1)
Basophils Absolute: 0 10*3/uL (ref 0.0–0.1)
Eosinophils Absolute: 0.3 10*3/uL (ref 0.0–0.7)
Eosinophils Relative: 6 % — ABNORMAL HIGH (ref 0–5)
HCT: 36.3 % — ABNORMAL LOW (ref 39.0–52.0)
HEMOGLOBIN: 12.4 g/dL — AB (ref 13.0–17.0)
LYMPHS ABS: 1 10*3/uL (ref 0.7–4.0)
Lymphocytes Relative: 22 % (ref 12–46)
MCH: 29.9 pg (ref 26.0–34.0)
MCHC: 34.2 g/dL (ref 30.0–36.0)
MCV: 87.5 fL (ref 78.0–100.0)
MONOS PCT: 7 % (ref 3–12)
Monocytes Absolute: 0.3 10*3/uL (ref 0.1–1.0)
NEUTROS ABS: 3.1 10*3/uL (ref 1.7–7.7)
NEUTROS PCT: 65 % (ref 43–77)
Platelets: 126 10*3/uL — ABNORMAL LOW (ref 150–400)
RBC: 4.15 MIL/uL — AB (ref 4.22–5.81)
RDW: 14.9 % (ref 11.5–15.5)
WBC: 4.8 10*3/uL (ref 4.0–10.5)

## 2013-07-16 LAB — COMPREHENSIVE METABOLIC PANEL
ALBUMIN: 3.9 g/dL (ref 3.5–5.2)
ALK PHOS: 165 U/L — AB (ref 39–117)
ALT: 46 U/L (ref 0–53)
AST: 62 U/L — ABNORMAL HIGH (ref 0–37)
BILIRUBIN TOTAL: 0.6 mg/dL (ref 0.3–1.2)
BUN: 13 mg/dL (ref 6–23)
CHLORIDE: 103 meq/L (ref 96–112)
CO2: 25 mEq/L (ref 19–32)
Calcium: 9.9 mg/dL (ref 8.4–10.5)
Creatinine, Ser: 1.02 mg/dL (ref 0.50–1.35)
GFR calc Af Amer: >90 mL/min (ref >90)
GFR calc non Af Amer: 86 mL/min — ABNORMAL LOW (ref >90)
Glucose, Bld: 97 mg/dL (ref 70–99)
POTASSIUM: 4.3 meq/L (ref 3.7–5.3)
Sodium: 139 mEq/L (ref 137–147)
Total Protein: 8.8 g/dL — ABNORMAL HIGH (ref 6.0–8.3)

## 2013-07-16 MED ORDER — OXYCODONE HCL 5 MG PO TABS: 5.0000 mg | ORAL_TABLET | ORAL | Status: DC | PRN

## 2013-07-16 MED ORDER — HYDROMORPHONE HCL PF 1 MG/ML IJ SOLN
1.0000 mg | INTRAMUSCULAR | Status: AC
  Administered 2013-07-16: 1 mg via INTRAVENOUS
  Filled 2013-07-16: qty 1

## 2013-07-16 MED ORDER — IOHEXOL 350 MG/ML SOLN
80.0000 mL | Freq: Once | INTRAVENOUS | Status: AC | PRN
  Administered 2013-07-16: 75 mL via INTRAVENOUS

## 2013-07-16 MED ORDER — ONDANSETRON HCL 4 MG/2ML IJ SOLN
4.0000 mg | Freq: Once | INTRAMUSCULAR | Status: AC
  Administered 2013-07-16: 4 mg via INTRAVENOUS
  Filled 2013-07-16: qty 2

## 2013-07-16 NOTE — ED Notes (Signed)
Returned from CT.

## 2013-07-16 NOTE — Discharge Instructions (Signed)
Read the information below.  Use the prescribed medication as directed.  Please discuss all new medications with your pharmacist.  You may return to the Emergency Department at any time for worsening condition or any new symptoms that concern you.    Chronic Pain Discharge Instructions  Emergency care providers appreciate that many patients coming to [place] are in severe pain and we wish to address their pain in the safest, most responsible manner.  It is important to recognize however, that the proper treatment of chronic pain differs from that of the pain of injuries and acute illnesses.  Our goal is to provide quality, safe, personalized care and we thank you for giving [place] the opportunity to serve you. The use of narcotics and related agents for chronic pain syndromes may lead to additional physical and psychological problems.  Nearly as many people die from prescription narcotics each year as die from car crashes.  Additionally, this risk is increased if such prescriptions are obtained from a variety of sources.  Therefore, only your primary care physician or a pain management specialist is able to safely treat such syndromes with narcotic medications long-term.    Documentation revealing such prescriptions have been sought from multiple sources may prohibit [place] from providing a refill or different narcotic medication.  Your name may be checked first through the [place].  This database is a record of controlled substance medication prescriptions that the patient has received.  This has been established by [facility] in an effort to eliminate the dangerous, and often life threatening, practice of obtaining multiple prescriptions from different medical providers.   If you have a chronic pain syndrome (i.e. chronic headaches, recurrent back or neck pain, dental pain, abdominal or pelvis pain without a specific diagnosis, or neuropathic pain such as fibromyalgia)  or recurrent visits for the same condition without an acute diagnosis, you may be treated with non-narcotics and other non-addictive medicines.  Allergic reactions or negative side effects that may be reported by a patient to such medications will not typically lead to the use of a narcotic analgesic or other controlled substance as an alternative.   Patients managing chronic pain with a personal physician should have provisions in place for breakthrough pain.  If you are in crisis, you should call your physician.  If your physician directs you to the emergency department, please have the doctor call and speak to our attending physician concerning your care.   When patients come to the Emergency Department (ED) with acute medical conditions in which the Emergency Department physician feels appropriate to prescribe narcotic or sedating pain medication, the physician will prescribe these in very limited quantities.  The amount of these medications will last only until you can see your primary care physician in his/her office.  Any patient who returns to the ED seeking refills should expect only non-narcotic pain medications.   In the event of an acute medical condition exists and the emergency physician feels it is necessary that the patient be given a narcotic or sedating medication -  a responsible adult driver should be present in the room prior to the medication being given by the nurse.   Prescriptions for narcotic or sedating medications that have been lost, stolen or expired will not be refilled in the Emergency Department.    Patients who have chronic pain may receive non-narcotic prescriptions until seen by their primary care physician.  It is every patients personal responsibility to maintain active prescriptions  with his or her primary care physician or specialist.    Chronic Pain Chronic pain can be defined as pain that is off and on and lasts for 3 6 months or longer. Many things cause  chronic pain, which can make it difficult to make a diagnosis. There are many treatment options available for chronic pain. However, finding a treatment that works well for you may require trying various approaches until the right one is found. Many people benefit from a combination of two or more types of treatment to control their pain. SYMPTOMS  Chronic pain can occur anywhere in the body and can range from mild to very severe. Some types of chronic pain include:  Headache.  Low back pain.  Cancer pain.  Arthritis pain.  Neurogenic pain. This is pain resulting from damage to nerves. People with chronic pain may also have other symptoms such as:  Depression.  Anger.  Insomnia.  Anxiety. DIAGNOSIS  Your health care provider will help diagnose your condition over time. In many cases, the initial focus will be on excluding possible conditions that could be causing the pain. Depending on your symptoms, your health care provider may order tests to diagnose your condition. Some of these tests may include:   Blood tests.   CT scan.   MRI.   X-rays.   Ultrasounds.   Nerve conduction studies.  You may need to see a specialist.  TREATMENT  Finding treatment that works well may take time. You may be referred to a pain specialist. He or she may prescribe medicine or therapies, such as:   Mindful meditation or yoga.  Shots (injections) of numbing or pain-relieving medicines into the spine or area of pain.  Local electrical stimulation.  Acupuncture.   Massage therapy.   Aroma, color, light, or sound therapy.   Biofeedback.   Working with a physical therapist to keep from getting stiff.   Regular, gentle exercise.   Cognitive or behavioral therapy.   Group support.  Sometimes, surgery may be recommended.  HOME CARE INSTRUCTIONS   Take all medicines as directed by your health care provider.   Lessen stress in your life by relaxing and doing things  such as listening to calming music.   Exercise or be active as directed by your health care provider.   Eat a healthy diet and include things such as vegetables, fruits, fish, and lean meats in your diet.   Keep all follow-up appointments with your health care provider.   Attend a support group with others suffering from chronic pain. SEEK MEDICAL CARE IF:   Your pain gets worse.   You develop a new pain that was not there before.   You cannot tolerate medicines given to you by your health care provider.   You have new symptoms since your last visit with your health care provider.  SEEK IMMEDIATE MEDICAL CARE IF:   You feel weak.   You have decreased sensation or numbness.   You lose control of bowel or bladder function.   Your pain suddenly gets much worse.   You develop shaking.  You develop chills.  You develop confusion.  You develop chest pain.  You develop shortness of breath.  MAKE SURE YOU:  Understand these instructions.  Will watch your condition.  Will get help right away if you are not doing well or get worse. Document Released: 03/20/2002 Document Revised: 02/28/2013 Document Reviewed: 12/22/2012 [facility] Patient Information 2014 [place].  Pleural Effusion The lining covering your  lungs and the inside of your chest is called the pleura. Usually, the space between the 2 pleura contains no air and only a thin layer of fluid. A pleural effusion is an abnormal buildup of fluid in the pleural space. Fluid gathers when there is increased pressure in the lung vessels. This forces fluids out of the lungs and into the pleural space. Vessels may also leak fluids when there are infections, such as pneumonia, or other causes of soreness and redness (inflammation). Fluids leak into the lungs when protein in the blood is low or when certain vessels (lymphatics) are blocked. Finding a pleural effusion is important because it is usually caused by  another disease. In order to treat a pleural effusion, your caregiver needs to find its cause. If left untreated, a large amount of fluid can build up and cause collapse of the lung. CAUSES   Heart failure.  Infections (pneumonia, tuberculosis), pulmonary embolism, pulmonary infarction.  Cancer (primary lung and metastatic), asbestosis.  Liver failure (cirrhosis).  Nephrotic syndrome, peritoneal dialysis, kidney problems (uremia).  Collagen vascular disease (systemic lupus erythematosis, rheumatoid arthritis).  Injury (trauma) to the chest or rupture of the digestive tube (esophagus).  Material in the chest or pleural space (hemothorax, chylothorax).  Pancreatitis.  Surgery.  Drug reactions. SYMPTOMS  A pleural effusion can decrease the amount of space available for breathing and make you short of breath. The fluid can become infected, which may cause pain and fever. Often, the pain is worse when taking a deep breath. The underlying disease (heart failure, pneumonia, blood clot, tuberculosis, cancer) may also cause symptoms. DIAGNOSIS   Your caregiver can usually tell what is wrong by talking to you (taking a history), doing an exam, and taking a routine X-ray. If the X-ray shows fluid in your chest, often fluid is removed from your chest with a needle for testing (diagnostic thoracentesis).  Sometimes, more specialized X-rays may be needed.  Sometimes, a small piece of tissue is removed and examined by a specialist (biopsy). TREATMENT  Treatment varies based on what caused the pleural effusion. Treatments include:  Removing as much fluid as possible using a needle (thoracentesis) to improve the cough and shortness of breath. This is a simple procedure which can be done at bedside. The risks are bleeding, infection, collapse of a lung, or low blood pressure.  Placing a tube in the chest to drain the effusion (tube thoracostomy). This is often used when there is an infection in  the fluid. This is a simple procedure which can often be done at bedside or in a clinic. The procedure may be painful. The risks are the same as using a needle to drain the fluid. The chest tube usually remains for a few days and is connected to suction to improve fluid drainage. The tube, after placement, usually does not cause much discomfort.  Surgical removal of fibrous debris in and around the pleural space (decortication). This may be done with a flexible telescope (thoracoscope) through a small or large cut (incision). This is helpful for patients who have fibrosis or scar tissue that prevents complete lung expansion. The risks are infection, blood loss, and side effects from general anesthesia.  Sometimes, a procedure called pleurodesis is done. A chest tube is placed and the fluid is drained. Next, an agent (tetracycline, talc powder) is added to the pleural space. This causes the lung and chest wall to stick together (adhesion). This leaves no potential space for fluid to build up. The risks  include infection, blood loss, and side effects from general anesthesia.  If the effusion is caused by infection, it may be treated with antibiotics and improve without draining. HOME CARE INSTRUCTIONS   Take any medicines exactly as prescribed.  Follow up with your caregiver as directed.  Monitor your exercise capacity (the amount of walking you can do before you get short of breath).  Do not smoke. Ask your caregiver for help quitting. SEEK MEDICAL CARE IF:   Your exercise capacity seems to get worse or does not improve with time.  You do not recover from your illness. SEEK IMMEDIATE MEDICAL CARE IF:   Shortness of breath or chest pain develops or gets worse.  You have an oral temperature above 102 F (38.9 C), not controlled by medicine.  You develop a new cough, especially if the mucus (phlegm) is discolored. MAKE SURE YOU:   Understand these instructions.  Will watch your  condition.  Will get help right away if you are not doing well or get worse. Document Released: 06/28/2005 Document Revised: 09/20/2011 Document Reviewed: 02/17/2007 [facility] Patient Information 2014 [place].    Emergency Department Resource Guide 1) Find a Doctor and Pay Out of Pocket Although you won't have to find out who is covered by your insurance plan, it is a good idea to ask around and get recommendations. You will then need to call the office and see if the doctor you have chosen will accept you as a new patient and what types of options they offer for patients who are self-pay. Some doctors offer discounts or will set up payment plans for their patients who do not have insurance, but you will need to ask so you aren't surprised when you get to your appointment.  2) Contact Your Local Health Department Not all health departments have doctors that can see patients for sick visits, but many do, so it is worth a call to see if yours does. If you don't know where your local health department is, you can check in your phone book. The CDC also has a tool to help you locate your state's health department, and many state websites also have listings of all of their local health departments.  3) Find a Walk-in Clinic If your illness is not likely to be very severe or complicated, you may want to try a walk in clinic. These are popping up all over the country in pharmacies, drugstores, and shopping centers. They're usually staffed by nurse practitioners or physician assistants that have been trained to treat common illnesses and complaints. They're usually fairly quick and inexpensive. However, if you have serious medical issues or chronic medical problems, these are probably not your best option.  No Primary Care Doctor: - Call Health Connect at  [phone] - they can help you locate a primary care doctor that  accepts your insurance, provides certain services, etc. - Physician Referral  Service- [phone]  Chronic Pain Problems: Organization         Address  Phone   Notes  [facility]  [phone] Patients need to be referred by their primary care doctor.   Medication Assistance: Organization         Address  Phone   Notes  [facility] Medication [facility] [address] [place], [place] [id number] [phone] --Must be a resident of [place] -- Must have NO insurance coverage whatsoever (no Medicaid/ Medicare, etc.) -- The pt. MUST have a primary  care doctor that directs their care regularly and follows them in the community   MedAssist  [phone]   [employer]  [phone]    Agencies that provide inexpensive medical care: Organization         Address  Phone   Notes  [facility]  [phone]   [facility]    [phone]   [facility] [address] [place], [place] [address] [phone]   [facility] [address], [place] [phone]   Planned Parenthood    [phone]   [facility]    [phone]   [facility]  [address], [place] Phone:  [phone], Fax:  [phone] Hours of Operation:  9 am - 6 pm, M-F.  Also accepts Medicaid/Medicare and self-pay.  [facility]  [address], [place] Phone: [phone], Fax: [phone]. Hours of Operation:  8:30 am - 5:30 pm, M-F.  Also accepts Medicaid and self-pay.  [facility] [place] [address], [place] Phone: [phone]   [facility] [address] Natasha Bence [place], [place] [phone], Ext. [phone] Mondays & Thursdays: 7-9 AM.  First 15 patients are seen on a first come, first serve basis.    Medicaid-accepting [facility] Providers:  Organization         Address  Phone   Notes  [facility] [address], [place] [phone] Also accepts self-pay patients.  [facility] [address] Laurell Josephs [place], [place]  [phone]   [facility] [address], [place] [phone]   [facility] [address] [place] [phone]   Renaye Rakers [address], [place]   [phone] Only accepts [place] Access [place] patients after they have their name applied to their card.   Self-Pay (no insurance) in [facility]:  Organization         Address  Phone   Notes  Sickle Cell Patients, [facility] [address] [place], [place] [phone]   [facility] [address] [place], [place] [phone]   [facility]  [address], [place] [phone]   [facility]/Dr. Osei-Bonsu  [address], [place] or [address], [place] [phone] Phone number for both [place] and [place] locations is the same.  Urgent Medical and [facility] [address] [place] [phone]   [facility] [address] [place] or [address] [phone]   [facility] [address] [place] [phone], phone; [phone], fax Sees patients 1st and 3rd Saturday of every month.  Must not qualify for public or private insurance (i.e. Medicaid, Medicare, [place] Health Choice, Veterans' Benefits)  Household income should be no more than 200% of the poverty level The clinic cannot treat you if you are pregnant or think you are pregnant  Sexually transmitted diseases are not treated at the clinic.    Dental Care: Organization         Address  Phone  Notes  [facility] Department of [facility] [address] [place], [place] [phone] Accepts  children up to age 37 who are enrolled in  Medicaid or [place] Health Choice; pregnant women with a Medicaid card; and children who have applied for Medicaid or [place] Health Choice, but were declined, whose parents can pay a reduced fee at time of service.  [facility] Department of [facility]  [address], [place] [phone] Accepts children up to age 66 who are enrolled in [place] or [place] Health Choice; pregnant women with a Medicaid card; and children who have applied for Medicaid or  Health Choice, but were declined, whose parents can pay a reduced fee at time of service.  [facility]  [address] [place], [place] [phone] Patients are seen by appointment only. Walk-ins are not accepted. [facility] will see patients 7 years of age and older. Monday - Tuesday (8am-5pm) Most Wednesdays (8:30-5pm) $30 per visit, cash only  [facility]  [address], [facility] [phone] Patients are seen by appointment only. Walk-ins are not accepted. [facility] will see patients 24 years of age and older. One Wednesday Evening (Monthly: Volunteer Based).  $30 per visit, cash only  [employer]  [phone] for adults; Children under age 42, call Graduate Pediatric Dentistry at [phone]. Children aged 64-14, please call [phone] to request a pediatric application.  Dental services are provided in all areas of dental care including fillings, crowns and bridges, complete and partial dentures, implants, gum treatment, root canals, and extractions. Preventive care is also provided. Treatment is provided to both adults and children. Patients are selected via a lottery and there is often a waiting list.   [facility] [address] [place]  [phone] [email or web address]   [facility] [address] [place], [place] [phone], Ext. [phone] Second and Fourth Thursday of each month, opens at 6:30  AM; Clinic ends at 9 AM.  Patients are seen on a first-come first-served basis, and a limited number are seen during each clinic.   [facility]  [address] Ether Griffins [place], [place] [phone]   Eligibility Requirements You must have lived in [place], [place], or [place] counties for at least the last three months.   You cannot be eligible for state or federal sponsored [place], including [employer], [place], or [employer].   You generally cannot be eligible for healthcare insurance through your employer.    How to apply: Eligibility screenings are held every Tuesday and Wednesday afternoon from 1:00 pm until 4:00 pm. You do not need an appointment for the interview!  [facility] [address], [place], [place] [phone]   [facility] Health Department  [phone]   [facility] Health Department  [phone]   [facility] Health Department  [phone]    Behavioral Health Resources in the Community: Intensive Outpatient Programs Organization         Address  Phone  Notes  [facility] Services [address], [place], [place] [phone]   [facility] Outpatient [address] [place], [place] [phone]   [facility] [address], [place], [place]  [phone]   [facility] Mental Health [address]  [place], [place] [phone] or [phone]   Substance Abuse Resources Organization         Address  Phone  Notes  [facility]  [phone]  [facility]  [phone]   The ley  [phone]   Floydene Flock  [phone]   Residential & Outpatient Substance Abuse Program  [phone]   Psychological Services Organization         Address  Phone  Notes  [facility] Behavioral Health  [phone]   [facility] Services  [phone]   [facility] Health [address], [place] [phone] or  [phone]    Mobile Crisis Teams Organization         Address  Phone  Notes  [facility], Mobile Crisis Care Unit  [phone]   [facility]  [address] [place], [place] [phone]   Doristine Locks [address] [place] [phone]    Self-Help/Support Groups Organization         Address  Phone             Notes  [facility] [place] - variety of support groups  336- I7437963 Call for more information  Narcotics Anonymous (NA), [facility] [address], [employer] [place]  2 meetings at this location   Statistician         Address  Phone  Notes  [facility] [address],    [place]  [phone]   [facility]  [address], [place] [address], [place], [place] [phone]   [facility] [address] [place], [place] [phone] Admissions: 8am-3pm M-F  [facility] [address],    [place], [place] [phone]   [facility] [address] [place], [place], [place] [phone]   [facility] [address]  [place], [place] [phone]   [facility] - Intensive Outpatient [address] Laurell Josephs [address], [place], [place] [phone]   [facility]  ([facility]iction [facility] [address] [place]  [place], [place] [phone] or [phone]   [facility] ([facility]) [address], [place], [place] [phone] Accepts Medicaid  [facility] [address]  [place] [phone] Substance Abuse/Addiction Treatment   [facility] Organization         Address  Phone  Notes  [facility]  [phone]   Angie Fava, PhD [address] Ervin Knack [place], [place]   [phone] or [phone]   [facility]   [address] [place], [place] [phone]   [facility] [address]  [place], [place] [id number] Insurance/Medicaid/sponsorship through [facility] [address]                                    [place], [place] [phone] Therapy/tele-psych/case  [facility] [address], [place] [phone]    Dr. Lolly Mustache  [phone]   [facility]  [employer] [facility] 1) [address], [place] 2) [address], [place] 3)  [address], [place] [phone]  [phone]   [facility] Child Abuse Hotline [phone] or [phone] (After Hours)

## 2013-07-16 NOTE — ED Notes (Signed)
Pt reports he is here for abdominal pain, chronic back pain and epigastric pain.  States he has been seen @ [facility] for a couple months, now is in legal battle and states they will no longer give him oxycodone 20mg  q 3 hrs, wanted to move him to 6 hrs.  Signed self out of hospital and is out of all his meds.  Reports he has Hep C, liver cirrhosis and possible pancreatic cancer.  Mentioned something about blood clots "close to breaking loose".

## 2013-07-16 NOTE — ED Notes (Signed)
Patient transported to CT 

## 2013-07-16 NOTE — ED Provider Notes (Signed)
CSN: [id number]     Arrival date & time 07/16/13  1437 History   First MD Initiated Contact with Patient 07/16/13 1821     Chief Complaint  Patient presents with  . Abdominal Pain  . Flank Pain   (Consider location/radiation/quality/duration/timing/severity/associated sxs/prior Treatment) The history is provided by the patient.   Patient with multiple chronic complaints.  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  Was receiving 10m oxycodone.  Was referred to pain management but was turned away when he had suboxone in his system.  States he has chronic back pain, abdominal pain, shortness of breath, tightness in his chest that is worse with deep inspiration.  Has sweats every morning and is constantly in severe pain.  Currently only taking valium for pain. States he hears popping sounds when he takes a deep breath.  He is in the process of filing for disability and also is filing for orange card to receive primary care services in [place]    Past Medical History  Diagnosis Date  . Back pain   . Cancer   . Cirrhosis    History reviewed. No pertinent past surgical history. History reviewed. No pertinent family history. History  Substance Use Topics  . Smoking status: Current Every Day Smoker -- 1.00 packs/day  . Smokeless tobacco: Not on file  . Alcohol Use: No     Comment: last use 11/21/12    Review of Systems  Constitutional: Positive for diaphoresis. Negative for fever and chills.  Respiratory: Positive for shortness of breath. Negative for cough.   Cardiovascular: Positive for chest pain.  Gastrointestinal: Positive for abdominal pain. Negative for nausea, vomiting and diarrhea.   Genitourinary: Negative for dysuria, urgency and frequency.  Musculoskeletal: Positive for back pain and neck pain.  Neurological: Positive for weakness (chronic, unchanged) and numbness.    Allergies  Review of patient's allergies indicates no known allergies.  Home Medications   Current Outpatient Rx  Name  Route  Sig  Dispense  Refill  . diazepam (VALIUM) 5 MG tablet   Oral   Take 5-15 mg by mouth at bedtime as needed for anxiety.          BP 165/127  Pulse 87  Temp(Src) 98.4 F (36.9 C) (Oral)  Resp 17  Wt 109 lb (49.442 kg)  SpO2 100% Physical Exam  Nursing note and vitals reviewed. Constitutional: He appears well-developed and well-nourished. No distress.  HENT:  Head: Normocephalic and atraumatic.  Neck: Neck supple.  Cardiovascular: Normal rate and regular rhythm.   Pulmonary/Chest: Effort normal. No respiratory distress. He has no wheezes. He has no rhonchi. He has no rales. He exhibits tenderness.  Question slight decreased breath sound on right.   Abdominal: Soft. He exhibits no distension and no mass. There is tenderness. There is no rebound and no guarding.  Neurological: He is alert. He exhibits normal muscle tone.  Skin: He is not diaphoretic.    ED Course  Procedures (including critical care time) Labs Review Labs Reviewed  CBC WITH DIFFERENTIAL - Abnormal; Notable for the following:    RBC 4.15 (*)    Hemoglobin 12.4 (*)    HCT 36.3 (*)  Platelets 126 (*)    Eosinophils Relative 6 (*)    All other components within normal limits  COMPREHENSIVE METABOLIC PANEL - Abnormal; Notable for the following:    Total Protein 8.8 (*)    AST 62 (*)    Alkaline Phosphatase 165 (*)    GFR calc non Af Amer 86 (*)    All other components within normal limits  URINALYSIS, ROUTINE W REFLEX MICROSCOPIC   Imaging Review Dg Chest 2 View  07/16/2013   CLINICAL DATA:  Pain, hypertension  EXAM: CHEST  2 VIEW  COMPARISON:  04/14/2013, 11/22/2012  FINDINGS:  Cardiac shadow is within normal limits. The lungs are well aerated bilaterally. Minimal blunting of the right costophrenic angle is noted which may represent a small effusion. This is new from the prior exam. No focal confluent infiltrate is seen. No bony abnormality is noted.  IMPRESSION: Minimal blunting of the right costophrenic angle. No other focal abnormality is noted.   Electronically Signed   By: Inez Catalina M.D.   On: 07/16/2013 16:14    EKG Interpretation   None      7:31 PM Discussed patient with Dr Betsey Holiday.  I also was able to review a few recent records from [facility].  Patient has seen both pulmonology and neurology recently (December) and appears to have left both appointments focused on his pain medication needs.     Patient checked on [place].  Has had no narcotic prescriptions since 05/25/13 (#30 oxycodone 39m)  Filed Vitals:   07/16/13 2230  BP: 109/50  Pulse:   Temp:   Resp:     MDM   1. Pleural effusion   2. Chronic pain   3. Cirrhosis   4. Chronic back pain    Pt with multiple chronic issues that have been largely unchanged x several months.  Pt was experiencing pleuritic chest pain and had abnormal CXR, with hx possible pancreatic/liver cancer pt has risk factors for blood clot, therefore CT angio chest ordered.  This was negative.  The symptoms have been ongoing since hospitalization in October. Pt hypertensive upon arrival ,likely from pain.  Labs unremarkable.  Slight elevation in AST and Alk Phos, though pt is reported to have cirrhosis.  Pt d/c home with #15 oxycodone 548m  Resources for PCP follow up.  Discussed result, findings, treatment, and follow up  with patient.  Pt given return precautions.  Pt verbalizes understanding and agrees with plan.          EmClayton BiblesPA-C 07/16/13 2352

## 2013-07-16 NOTE — ED Notes (Signed)
Pt c/o lower abd pain with pain in flank, back and rib area x several months getting more severe

## 2013-07-18 ENCOUNTER — Telehealth: Payer: Self-pay

## 2013-07-20 NOTE — ED Provider Notes (Signed)
Medical screening examination/treatment/procedure(s) were performed by non-physician practitioner and as supervising physician I was immediately available for consultation/collaboration.  Orpah Greek, MD 07/20/13 [phone]

## 2013-07-22 ENCOUNTER — Emergency Department (HOSPITAL_COMMUNITY)
Admission: EM | Admit: 2013-07-22 | Discharge: 2013-07-22 | Disposition: A | Discharge status: Home / Self Care | Payer: Medicaid Other | Attending: Emergency Medicine | Admitting: Emergency Medicine

## 2013-07-22 ENCOUNTER — Encounter (HOSPITAL_COMMUNITY): Payer: Self-pay | Admitting: Emergency Medicine

## 2013-07-22 DIAGNOSIS — R079 Chest pain, unspecified: Secondary | ICD-10-CM | POA: Insufficient documentation

## 2013-07-22 DIAGNOSIS — R109 Unspecified abdominal pain: Secondary | ICD-10-CM | POA: Insufficient documentation

## 2013-07-22 DIAGNOSIS — M546 Pain in thoracic spine: Secondary | ICD-10-CM | POA: Insufficient documentation

## 2013-07-22 DIAGNOSIS — G8929 Other chronic pain: Secondary | ICD-10-CM | POA: Insufficient documentation

## 2013-07-22 DIAGNOSIS — Z79899 Other long term (current) drug therapy: Secondary | ICD-10-CM | POA: Insufficient documentation

## 2013-07-22 DIAGNOSIS — M545 Low back pain, unspecified: Secondary | ICD-10-CM | POA: Insufficient documentation

## 2013-07-22 DIAGNOSIS — Z8719 Personal history of other diseases of the digestive system: Secondary | ICD-10-CM | POA: Insufficient documentation

## 2013-07-22 DIAGNOSIS — M549 Dorsalgia, unspecified: Secondary | ICD-10-CM

## 2013-07-22 DIAGNOSIS — F17200 Nicotine dependence, unspecified, uncomplicated: Secondary | ICD-10-CM | POA: Insufficient documentation

## 2013-07-22 DIAGNOSIS — M542 Cervicalgia: Secondary | ICD-10-CM | POA: Insufficient documentation

## 2013-07-22 DIAGNOSIS — Z760 Encounter for issue of repeat prescription: Secondary | ICD-10-CM | POA: Insufficient documentation

## 2013-07-22 DIAGNOSIS — Z859 Personal history of malignant neoplasm, unspecified: Secondary | ICD-10-CM | POA: Insufficient documentation

## 2013-07-22 MED ORDER — OXYCODONE HCL 5 MG PO TABS: 5.0000 mg | ORAL_TABLET | ORAL | Status: DC | PRN

## 2013-07-22 MED ORDER — OXYCODONE HCL 5 MG PO TABS
10.0000 mg | ORAL_TABLET | Freq: Once | ORAL | Status: AC
  Administered 2013-07-22: 10 mg via ORAL
  Filled 2013-07-22: qty 2

## 2013-07-22 NOTE — ED Notes (Signed)
Pt in c/o chronic back and abdominal pain, states he hasn't been able to get into a regular doctor, out of pain medication at home

## 2013-07-22 NOTE — ED Notes (Signed)
RN entered room pt by sharps box- pt jumped back and sanitizing hands sts he accidentally hit box- sharps box is lock box is open. Pt sts without RN asking "I didn't have a key or anything I just bumped into it"

## 2013-07-22 NOTE — ED Provider Notes (Signed)
CSN: [id number]     Arrival date & time 07/22/13  1942 History   First MD Initiated Contact with Patient 07/22/13 2111     This chart was scribed for non-physician practitioner, Alecia Lemming PA-C working with Leota Jacobsen, MD by Forrestine Him, ED Scribe. This patient was seen in room TR11C/TR11C and the patient's care was started at 9:17 PM.   Chief Complaint  Patient presents with  . Back Pain  . Abdominal Pain   HPI  HPI Comments: Rodney Reed is a 48 y.o. Male with a PMHx of back pain and cirrhosis of the liver who presents to the Emergency Department complaining of chronic, ongoing, constant, moderate lower back pain and abdominal pain that initially started 1 year ago, but has worsened in the last 2 days. Pt was recently seen on 07/16/13 for abdominal and flank pain. He had CT angio at that time which showed small R pleural effusion, no PE. He states his current back pain is radiating up his back, along with shooting pain down his arms and into his digits bilaterally that he describes as a "firey burning" feeling. He reports an initial injury to his back resulting in his current chronic problems. His pain today is same as previous pain. He denies any bowel or urinary changes, numbness to the groin area, or weakness to his lower extremities. Pt is currently in the process of getting his [employer], and hopes to get established with providers for his needs.   He admits to multiple episodes of CP onset 3 days. He has chronic R sided chest pain which he has intermittently and is unchanged. Yesterday he had a brief episode of R chest pain. He says the pain was intense enough to bring him to his knees, and describes the pain as "stabbing". He states it lasted for about 4-5 minutes. No SOB when the CP is brought on.   Past Medical History  Diagnosis Date  . Back pain   . Cancer   . Cirrhosis    History reviewed. No pertinent past surgical history. History reviewed. No pertinent family  history. History  Substance Use Topics  . Smoking status: Current Every Day Smoker -- 1.00 packs/day  . Smokeless tobacco: Not on file  . Alcohol Use: No     Comment: last use 11/21/12    Review of Systems  Constitutional: Negative for fever and diaphoresis.  Eyes: Negative for redness.  Respiratory: Negative for cough and shortness of breath.   Cardiovascular: Positive for chest pain. Negative for palpitations and leg swelling.  Gastrointestinal: Negative for nausea, vomiting and abdominal pain.  Genitourinary: Negative for dysuria.  Musculoskeletal: Positive for back pain. Negative for neck pain.  Skin: Negative for rash.  Neurological: Negative for syncope, weakness, light-headedness and numbness.    Allergies  Vancomycin  Home Medications   Current Outpatient Rx  Name  Route  Sig  Dispense  Refill  . diazepam (VALIUM) 5 MG tablet   Oral   Take 5-15 mg by mouth at bedtime as needed for anxiety.         Marland Kitchen oxyCODONE (ROXICODONE) 5 MG immediate release tablet   Oral   Take 1-2 tablets (5-10 mg total) by mouth every 4 (four) hours as needed for severe pain.   15 tablet   0    Triage Vitals: BP 138/71  Pulse 99  Temp(Src) 98 F (36.7 C) (Oral)  Resp 18  Wt 192 lb 6 oz (87.261 kg)  SpO2 100%  Physical Exam  Nursing note and vitals reviewed. Constitutional: He appears well-developed and well-nourished.  HENT:  Head: Normocephalic and atraumatic.  Mouth/Throat: Mucous membranes are normal. Mucous membranes are not dry.  Eyes: Conjunctivae and EOM are normal.  Neck: Trachea normal and normal range of motion. Neck supple. Normal carotid pulses and no JVD present. No muscular tenderness present. Carotid bruit is not present. No tracheal deviation present.  Cardiovascular: Normal rate, regular rhythm, S1 normal, S2 normal, normal heart sounds and intact distal pulses.  Exam reveals no distant heart sounds and no decreased pulses.   No murmur heard. Pulmonary/Chest:  Effort normal and breath sounds normal. No respiratory distress. He has no wheezes. He exhibits no tenderness.  Abdominal: Soft. Normal aorta and bowel sounds are normal. There is no tenderness. There is no rebound and no guarding.  No abdominal masses or bruits.   Musculoskeletal: Normal range of motion. He exhibits tenderness. He exhibits no edema.       Cervical back: He exhibits tenderness. He exhibits no bony tenderness.       Thoracic back: He exhibits tenderness. He exhibits no bony tenderness.       Lumbar back: He exhibits tenderness. He exhibits no bony tenderness.  Paraspinous tenderness over entire spine.   Neurological: He is alert.  Skin: Skin is warm and dry. He is not diaphoretic. No cyanosis. No pallor.  Psychiatric: He has a normal mood and affect. His behavior is normal.    ED Course  Procedures (including critical care time)  DIAGNOSTIC STUDIES: Oxygen Saturation is 100% on RA, Normal by my interpretation.    COORDINATION OF CARE: 9:33 PM- Will give pain medication. Discussed treatment plan with pt at bedside and pt agreed to plan.     Labs Review Labs Reviewed - No data to display Imaging Review No results found.  EKG Interpretation   None      Patient seen and examined. Work-up initiated. Medications ordered.   Vital signs reviewed and are as follows: Filed Vitals:   07/22/13 2144  BP: 138/78  Pulse: 91  Temp: 97.4 F (36.3 C)  Resp: 20   Will give pain medication. Patient urged to f/u with his appt to obtain orange card. Stressed importance of PCP given his chronic medical issues.   Patient urged to return with worsening symptoms or other concerns. Patient verbalized understanding and agrees with plan.   Prior to discharge, nurse found patient tempering with the sharps container. Container was open.    MDM   1. Back pain   2. Medication refill    Back pain: This is patient's chronic back pain. The characteristic of pain is unchanged. He  has no red flag s/s of back pain. Will give medicine for pain control. Patient's main complaint is this pain and that he is out of pain medication.   Chest pain: Patient has chronic R CP that he has had for months. This is unchanged. No other concerning symptoms. One episode of CP yesterday was short-lived (5 minutes), sharp, stabbing, changed with movement. No concern for ACS with this presentation. Doubt PE -- patient had recent neg CT angio. No leg swelling. No vascular problem demonstrated on CT. No pain currently. No emergent or dangerous cause of this pain suspected.   I personally performed the services described in this documentation, which was scribed in my presence. The recorded information has been reviewed and is accurate.   Carlisle Cater, PA-C 07/23/13 [phone]

## 2013-07-22 NOTE — Discharge Instructions (Signed)
Please read and follow all provided instructions.  Your diagnoses today include:  1. Back pain   2. Medication refill     Tests performed today include:  Vital signs - see below for your results today  Medications prescribed:   Oxycodone - narcotic pain medication  DO NOT drive or perform any activities that require you to be awake and alert because this medicine can make you drowsy.   Take any prescribed medications only as directed.  Home care instructions:   Follow any educational materials contained in this packet  Please rest, use ice or heat on your back for the next several days  Do not lift, push, pull anything more than 10 pounds for the next week  Follow-up instructions: Please follow-up with your primary care provider in the next 1 week for further evaluation of your symptoms. If you do not have a primary care doctor -- see below for referral information.   Return instructions:  SEEK IMMEDIATE MEDICAL ATTENTION IF YOU HAVE:  New numbness, tingling, weakness, or problem with the use of your arms or legs  Severe back pain not relieved with medications  Loss control of your bowels or bladder  Increasing pain in any areas of the body (such as chest or abdominal pain)  Shortness of breath, dizziness, or fainting.   Worsening nausea (feeling sick to your stomach), vomiting, fever, or sweats  Any other emergent concerns regarding your health   Additional Information:  Your vital signs today were: BP 138/71   Pulse 99   Temp(Src) 98 F (36.7 C) (Oral)   Resp 18   Wt 192 lb 6 oz (87.261 kg)   SpO2 100% If your blood pressure (BP) was elevated above 135/85 this visit, please have this repeated by your doctor within one month. --------------  Emergency Department Resource Guide 1) Find a Doctor and Pay Out of Pocket Although you won't have to find out who is covered by your insurance plan, it is a good idea to ask around and get recommendations. You will then  need to call the office and see if the doctor you have chosen will accept you as a new patient and what types of options they offer for patients who are self-pay. Some doctors offer discounts or will set up payment plans for their patients who do not have insurance, but you will need to ask so you aren't surprised when you get to your appointment.  2) Contact Your Local Health Department Not all health departments have doctors that can see patients for sick visits, but many do, so it is worth a call to see if yours does. If you don't know where your local health department is, you can check in your phone book. The CDC also has a tool to help you locate your state's health department, and many state websites also have listings of all of their local health departments.  3) Find a [facility] If your illness is not likely to be very severe or complicated, you may want to try a walk in clinic. These are popping up all over the country in pharmacies, drugstores, and shopping centers. They're usually staffed by nurse practitioners or physician assistants that have been trained to treat common illnesses and complaints. They're usually fairly quick and inexpensive. However, if you have serious medical issues or chronic medical problems, these are probably not your best option.  No Primary Care Doctor: - Call Health Connect at  [phone] - they can help you locate  a primary care doctor that  accepts your insurance, provides certain services, etc. - Physician Referral Service- [phone]  Chronic Pain Problems: Organization         Address  Phone   Notes  [facility]  [phone] Patients need to be referred by their primary care doctor.   Medication Assistance: Organization         Address  Phone   Notes  [facility] Medication [facility] [address] [place], [place] [address] [phone] --Must be a resident of [facility] -- Must have NO  insurance coverage whatsoever (no Medicaid/ Medicare, etc.) -- The pt. MUST have a primary care doctor that directs their care regularly and follows them in the community   [facility]ist  [phone]   [employer]  [phone]    Agencies that provide inexpensive medical care: Organization         Address  Phone   Notes  [place]  [phone]   [facility]    [phone]   [facility] [place], [place] [address] [phone]   [place] [address] [place] [phone]   Planned Parenthood    [phone]   [facility]    [phone]   [place] and [address], [place] Phone:  [phone], Fax:  [phone] Hours of Operation:  9 am - 6 pm, M-F.  Also accepts Medicaid/Medicare and self-pay.  [facility] for [place], [address], [place] Phone: [phone], Fax: [phone]. Hours of Operation:  8:30 am - 5:30 pm, M-F.  Also accepts Medicaid and self-pay.  [facility] [place] [address] [place] Phone: [phone]   [place], [place], [place] [phone], Ext. [phone] Mondays & Thursdays: 7-9 AM.  First 15 patients are seen on a first come, first serve basis.    [place] Providers:  Organization         Address  Phone   Notes  [facility] [address], [place] [phone] Also accepts self-pay patients.  [facility] [id number] [place], [place]  [phone]   [place], [address], [place] [phone]   [facility] [address], [place] [phone]   Lucianne Lei [address], [place]   [phone] Only accepts [place] Access [place] patients after they have  their name applied to their card.   Self-Pay (no insurance) in [facility]:  Organization         Address  Phone   Notes  Sickle Cell Patients, [facility] [place] [phone]   [facility] [place] [phone]   [facility] [place]  [place], [place],  [phone]   [facility]/Dr. Osei-Bonsu  [address] [place] or [address], [place] [phone] Phone number for both [place] and [place] locations is the same.  Urgent Medical and [facility] [address], [place] [phone]   [facility] [address] [place] or [address] [phone] (  [phone]   [place] [phone], phone; [phone], fax Sees patients 1st and 3rd Saturday of every month.  Must not qualify for public or private insurance (i.e. Medicaid, Medicare, [place] Health Choice, Veterans' Benefits)  Household income should be no more than 200% of the poverty level The clinic cannot treat you if you are pregnant or think you are pregnant  Sexually transmitted diseases are not treated at the clinic.    Dental Care: Organization         Address  Phone  Notes  [facility] Department of [facility] [place] [phone] Accepts children up to age 6 who are enrolled in [place] or [place]; pregnant women with a Medicaid card; and children who have applied for Medicaid or [place] Health Choice, but were declined, whose parents can pay a reduced fee at time of service.  [facility] Department of [facility]  [address], [place] [phone] Accepts children up to age 62 who are enrolled in [place] or [place]; pregnant women with a Medicaid card; and children who have applied  for Medicaid or [place] Health Choice, but were declined, whose parents can pay a reduced fee at time of service.  [place] Adult Dental Access PROGRAM  [place] [phone] Patients are seen by appointment only. Walk-ins are not accepted. [place] will see patients 56 years of age and older. Monday - Tuesday (8am-5pm) Most Wednesdays (8:30-5pm) $30 per visit, cash only  [facility] Adult Dental Access PROGRAM  [address], [facility] [phone] Patients are seen by appointment only. Walk-ins are not accepted. [place] will see patients 34 years of age and older. One Wednesday Evening (Monthly: Volunteer Based).  $30 per visit, cash only  [place]  [phone] for adults; Children under age 58, call Graduate Pediatric Dentistry at [phone]. Children aged 93-14, please call [phone] to request a pediatric application.  Dental services are provided in all areas of dental care including fillings, crowns and bridges, complete and partial dentures, implants, gum treatment, root canals, and extractions. Preventive care is also provided. Treatment is provided to both adults and children. Patients are selected via a lottery and there is often a waiting list.   [facility] [address] [place]  [phone] [email or web address]   [facility] [address] [place], [place] [phone], Ext. [phone] Second and Fourth Thursday of each month, opens at 6:30 AM; Clinic ends at 9 AM.  Patients are seen on a first-come first-served basis, and a limited number are seen during each clinic.   [facility]  [address] Hillard Danker [place], [place] [phone]   Eligibility Requirements You must have lived in [place], [place], or [place] counties for at least the last three months.   You cannot be eligible for state or federal sponsored [employer], including [employer],  [place], or [employer].   You generally cannot be eligible for healthcare insurance through your employer.    How to apply: Eligibility screenings are held every Tuesday and Wednesday afternoon from 1:00 pm until 4:00 pm. You do not need an appointment for the interview!  [facility] [address] [place], [place]   [place]  [phone]   [place] Department  [phone]  [place] in the Community: Intensive Outpatient Programs Organization         Address  Phone  Notes  [place]. [address], [place], [place] [phone]   [facility] Outpatient [address], [place], [place]   ADS: Alcohol & Drug Svcs [address], [place], [place]   [place] [address],  [place], [place] or [phone]   Substance Abuse Resources Organization         Address  Phone  Notes  Alcohol and Drug Services  [phone]   [place]  [phone]   [facility] [place]   Chinita Pester  [phone]   Residential & Outpatient Substance Abuse Program  [phone]   Psychological Services Organization         Address  Phone  Notes  [facility] [place]  [place]  [phone]   [place] [address], [place] or [phone]    Mobile Crisis Teams Organization         Address  Phone  Notes  [facility], Mobile Crisis Care Unit  [phone]   [facility]  [address] [place], [place]   [place] [address] [place] [phone]    Self-Help/Support Groups Organization         Address  Phone             Notes  [place]. of [place] - variety of support  groups  [place] Call for more information  Narcotics Anonymous (NA), [facility] [address], [employer] [place]  2 meetings at this location   Special educational needs teacher         Address  Phone  Notes  [facility] Residential Treatment [place],    [place]  [phone]   [facility]  [address], [place] [id number], [place], [place]   [place], [place] [phone] Admissions: 8am-3pm M-F  Incentives Substance [place] [address]    [place], [place] [phone]   [facility] [address] [place], [place], [place]   [facility] [address],  [place], [place]   [facility] Programs - Intensive Outpatient [address] Kristeen [address], [place], [place]   [facility] ([place].,  [place], [place] [phone] or [phone]   [facility] (RTS) [address] [place], [place] Accepts Medicaid  [facility] [place] [address]  [place] [phone] Substance Abuse/Addiction Treatment   [facility] Organization         Address  Phone  Notes  [facility]  [phone]   Domenic Schwab, PhD [address] Arlis Porta [place], [place]   [phone] or [phone]   [place], [place] [phone]   [place] [address], [place], [place] [phone] Insurance/Medicaid/sponsorship through [employer] and Families [address] [place]                                    [place], [place] [phone] Therapy/tele-psych/case  [facility] [place], [place] [phone]    Dr. Adele Schilder  [phone]   [facility] Dept. 1) [address], [place] 2) [place] 3)   [place] [address], [place] [phone]  [phone]   [place] [phone] or [phone] (After Hours)

## 2013-07-25 NOTE — ED Provider Notes (Signed)
Medical screening examination/treatment/procedure(s) were performed by non-physician practitioner and as supervising physician I was immediately available for consultation/collaboration.  Zamani Crocker T Chet Greenley, MD 07/25/13 1939 

## 2013-07-26 ENCOUNTER — Encounter (HOSPITAL_COMMUNITY): Payer: Self-pay | Admitting: Emergency Medicine

## 2013-07-26 ENCOUNTER — Emergency Department (HOSPITAL_COMMUNITY)
Admission: EM | Admit: 2013-07-26 | Discharge: 2013-07-26 | Disposition: A | Discharge status: Home / Self Care | Payer: Medicaid Other | Attending: Emergency Medicine | Admitting: Emergency Medicine

## 2013-07-26 ENCOUNTER — Emergency Department (HOSPITAL_COMMUNITY): Payer: Medicaid Other

## 2013-07-26 DIAGNOSIS — Z8719 Personal history of other diseases of the digestive system: Secondary | ICD-10-CM | POA: Insufficient documentation

## 2013-07-26 DIAGNOSIS — Z79899 Other long term (current) drug therapy: Secondary | ICD-10-CM | POA: Insufficient documentation

## 2013-07-26 DIAGNOSIS — R109 Unspecified abdominal pain: Secondary | ICD-10-CM

## 2013-07-26 DIAGNOSIS — Z859 Personal history of malignant neoplasm, unspecified: Secondary | ICD-10-CM | POA: Insufficient documentation

## 2013-07-26 DIAGNOSIS — F17200 Nicotine dependence, unspecified, uncomplicated: Secondary | ICD-10-CM | POA: Insufficient documentation

## 2013-07-26 HISTORY — DX: Acute pancreatitis without necrosis or infection, unspecified: K85.90

## 2013-07-26 LAB — CBC WITH DIFFERENTIAL/PLATELET
BASOS ABS: 0 10*3/uL (ref 0.0–0.1)
BASOS PCT: 0 % (ref 0–1)
Eosinophils Absolute: 0.2 10*3/uL (ref 0.0–0.7)
Eosinophils Relative: 4 % (ref 0–5)
HCT: 36.5 % — ABNORMAL LOW (ref 39.0–52.0)
Hemoglobin: 12.4 g/dL — ABNORMAL LOW (ref 13.0–17.0)
LYMPHS PCT: 22 % (ref 12–46)
Lymphs Abs: 1.2 10*3/uL (ref 0.7–4.0)
MCH: 30 pg (ref 26.0–34.0)
MCHC: 34 g/dL (ref 30.0–36.0)
MCV: 88.4 fL (ref 78.0–100.0)
Monocytes Absolute: 0.5 10*3/uL (ref 0.1–1.0)
Monocytes Relative: 8 % (ref 3–12)
NEUTROS ABS: 3.6 10*3/uL (ref 1.7–7.7)
NEUTROS PCT: 66 % (ref 43–77)
Platelets: 111 10*3/uL — ABNORMAL LOW (ref 150–400)
RBC: 4.13 MIL/uL — ABNORMAL LOW (ref 4.22–5.81)
RDW: 14.1 % (ref 11.5–15.5)
WBC: 5.5 10*3/uL (ref 4.0–10.5)

## 2013-07-26 LAB — URINALYSIS, ROUTINE W REFLEX MICROSCOPIC
Bilirubin Urine: NEGATIVE
Glucose, UA: NEGATIVE mg/dL
Hgb urine dipstick: NEGATIVE
Ketones, ur: NEGATIVE mg/dL
LEUKOCYTES UA: NEGATIVE
Nitrite: NEGATIVE
PH: 6 (ref 5.0–8.0)
Protein, ur: NEGATIVE mg/dL
Specific Gravity, Urine: 1.014 (ref 1.005–1.030)
Urobilinogen, UA: 0.2 mg/dL (ref 0.0–1.0)

## 2013-07-26 LAB — COMPREHENSIVE METABOLIC PANEL
ALT: 50 U/L (ref 0–53)
AST: 55 U/L — AB (ref 0–37)
Albumin: 4 g/dL (ref 3.5–5.2)
Alkaline Phosphatase: 164 U/L — ABNORMAL HIGH (ref 39–117)
BUN: 13 mg/dL (ref 6–23)
CO2: 23 mEq/L (ref 19–32)
Calcium: 9.5 mg/dL (ref 8.4–10.5)
Chloride: 102 mEq/L (ref 96–112)
Creatinine, Ser: 1.01 mg/dL (ref 0.50–1.35)
GFR calc Af Amer: >90 mL/min (ref >90)
GFR calc non Af Amer: 87 mL/min — ABNORMAL LOW (ref >90)
Glucose, Bld: 96 mg/dL (ref 70–99)
POTASSIUM: 4.3 meq/L (ref 3.7–5.3)
SODIUM: 138 meq/L (ref 137–147)
TOTAL PROTEIN: 8.6 g/dL — AB (ref 6.0–8.3)
Total Bilirubin: 0.6 mg/dL (ref 0.3–1.2)

## 2013-07-26 LAB — LIPASE, BLOOD: LIPASE: 81 U/L — AB (ref 11–59)

## 2013-07-26 MED ORDER — ONDANSETRON HCL 4 MG/2ML IJ SOLN
4.0000 mg | Freq: Once | INTRAMUSCULAR | Status: AC
  Administered 2013-07-26: 4 mg via INTRAVENOUS
  Filled 2013-07-26: qty 2

## 2013-07-26 MED ORDER — HYDROMORPHONE HCL PF 1 MG/ML IJ SOLN
1.0000 mg | Freq: Once | INTRAMUSCULAR | Status: AC
  Administered 2013-07-26: 1 mg via INTRAVENOUS
  Filled 2013-07-26: qty 1

## 2013-07-26 MED ORDER — HYDROCODONE-ACETAMINOPHEN 5-325 MG PO TABS: 1.0000 | ORAL_TABLET | Freq: Four times a day (QID) | ORAL | Status: DC | PRN

## 2013-07-26 MED ORDER — IOHEXOL 300 MG/ML  SOLN
25.0000 mL | INTRAMUSCULAR | Status: AC
  Administered 2013-07-26: 25 mL via ORAL

## 2013-07-26 MED ORDER — IOHEXOL 300 MG/ML  SOLN
100.0000 mL | Freq: Once | INTRAMUSCULAR | Status: AC | PRN
  Administered 2013-07-26: 100 mL via INTRAVENOUS

## 2013-07-26 NOTE — ED Provider Notes (Signed)
CSN: [id number]     Arrival date & time 07/26/13  1049 History   First MD Initiated Contact with Patient 07/26/13 1634     Chief Complaint  Patient presents with  . Abdominal Pain   (Consider location/radiation/quality/duration/timing/severity/associated sxs/prior Treatment) Patient is a 48 y.o. male presenting with abdominal pain. The history is provided by the patient.  Abdominal Pain Associated symptoms: no chest pain, no chills, no constipation, no cough, no diarrhea, no dysuria, no fever, no hematuria, no shortness of breath, no sore throat and no vomiting   pt w hx cirrhosis, pancreatitis, presents w right sided, upper/lower, abd pain for the past couple days. Constant. Dull. Occasionally radiates to back.   No specific exacerbating or alleviating factors. Mod-sev. Unsure if same as prior pain. No abd trauma or fall. No nvd. Normal bms. No abd distension. No gu c/o. No prior abd surgery. No cough or uri c/o. No fever or chills.      Past Medical History  Diagnosis Date  . Back pain   . Cancer   . Cirrhosis   . Pancreatitis    History reviewed. No pertinent past surgical history. History reviewed. No pertinent family history. History  Substance Use Topics  . Smoking status: Current Every Day Smoker -- 1.00 packs/day  . Smokeless tobacco: Not on file  . Alcohol Use: No     Comment: last use 11/21/12    Review of Systems  Constitutional: Negative for fever and chills.  HENT: Negative for sore throat.   Eyes: Negative for redness.  Respiratory: Negative for cough and shortness of breath.   Cardiovascular: Negative for chest pain and leg swelling.  Gastrointestinal: Positive for abdominal pain. Negative for vomiting, diarrhea, constipation and abdominal distention.  Genitourinary: Negative for dysuria, hematuria and flank pain.  Musculoskeletal: Negative for back pain and neck pain.  Skin: Negative for rash.  Neurological: Negative for headaches.  Hematological: Does not  bruise/bleed easily.  Psychiatric/Behavioral: Negative for confusion.    Allergies  Fish allergy and Vancomycin  Home Medications   Current Outpatient Rx  Name  Route  Sig  Dispense  Refill  . ALPRAZolam (XANAX) 0.5 MG tablet   Oral   Take 0.5 mg by mouth at bedtime as needed for anxiety or sleep.         . traMADol (ULTRAM) 50 MG tablet   Oral   Take 50 mg by mouth every 6 (six) hours as needed for moderate pain.          BP 133/73  Pulse 75  Temp(Src) 97.7 F (36.5 C) (Oral)  Resp 22  Ht 6' (1.829 m)  Wt 192 lb (87.091 kg)  BMI 26.03 kg/m2  SpO2 99% Physical Exam  Nursing note and vitals reviewed. Constitutional: He is oriented to person, place, and time. He appears well-developed and well-nourished. No distress.  HENT:  Nose: Nose normal.  Mouth/Throat: Oropharynx is clear and moist.  Eyes: Conjunctivae are normal. No scleral icterus.  Neck: Neck supple. No tracheal deviation present.  Cardiovascular: Normal rate, regular rhythm, normal heart sounds and intact distal pulses.  Exam reveals no gallop and no friction rub.   No murmur heard. Pulmonary/Chest: Effort normal and breath sounds normal. No accessory muscle usage. No respiratory distress.  Abdominal: Soft. Bowel sounds are normal. He exhibits no distension and no mass. There is tenderness. There is no rebound and no guarding.  Right abd tenderness. Moderate.  Genitourinary:  No cva tenderness  Musculoskeletal: Normal range of motion.  Neurological: He is alert and oriented to person, place, and time.  Skin: Skin is warm and dry.  Psychiatric: He has a normal mood and affect.    ED Course  Procedures (including critical care time)  Results for orders placed during the hospital encounter of 07/26/13  CBC WITH DIFFERENTIAL      Result Value Range   WBC 5.5  4.0 - 10.5 K/uL   RBC 4.13 (*) 4.22 - 5.81 MIL/uL   Hemoglobin 12.4 (*) 13.0 - 17.0 g/dL   HCT 36.5 (*) 39.0 - 52.0 %   MCV 88.4  78.0 - 100.0  fL   MCH 30.0  26.0 - 34.0 pg   MCHC 34.0  30.0 - 36.0 g/dL   RDW 14.1  11.5 - 15.5 %   Platelets 111 (*) 150 - 400 K/uL   Neutrophils Relative % 66  43 - 77 %   Neutro Abs 3.6  1.7 - 7.7 K/uL   Lymphocytes Relative 22  12 - 46 %   Lymphs Abs 1.2  0.7 - 4.0 K/uL   Monocytes Relative 8  3 - 12 %   Monocytes Absolute 0.5  0.1 - 1.0 K/uL   Eosinophils Relative 4  0 - 5 %   Eosinophils Absolute 0.2  0.0 - 0.7 K/uL   Basophils Relative 0  0 - 1 %   Basophils Absolute 0.0  0.0 - 0.1 K/uL  LIPASE, BLOOD      Result Value Range   Lipase 81 (*) 11 - 59 U/L  COMPREHENSIVE METABOLIC PANEL      Result Value Range   Sodium 138  137 - 147 mEq/L   Potassium 4.3  3.7 - 5.3 mEq/L   Chloride 102  96 - 112 mEq/L   CO2 23  19 - 32 mEq/L   Glucose, Bld 96  70 - 99 mg/dL   BUN 13  6 - 23 mg/dL   Creatinine, Ser 1.01  0.50 - 1.35 mg/dL   Calcium 9.5  8.4 - 10.5 mg/dL   Total Protein 8.6 (*) 6.0 - 8.3 g/dL   Albumin 4.0  3.5 - 5.2 g/dL   AST 55 (*) 0 - 37 U/L   ALT 50  0 - 53 U/L   Alkaline Phosphatase 164 (*) 39 - 117 U/L   Total Bilirubin 0.6  0.3 - 1.2 mg/dL   GFR calc non Af Amer 87 (*) >90 mL/min   GFR calc Af Amer >90  >90 mL/min  URINALYSIS, ROUTINE W REFLEX MICROSCOPIC      Result Value Range   Color, Urine YELLOW  YELLOW   APPearance CLEAR  CLEAR   Specific Gravity, Urine 1.014  1.005 - 1.030   pH 6.0  5.0 - 8.0   Glucose, UA NEGATIVE  NEGATIVE mg/dL   Hgb urine dipstick NEGATIVE  NEGATIVE   Bilirubin Urine NEGATIVE  NEGATIVE   Ketones, ur NEGATIVE  NEGATIVE mg/dL   Protein, ur NEGATIVE  NEGATIVE mg/dL   Urobilinogen, UA 0.2  0.0 - 1.0 mg/dL   Nitrite NEGATIVE  NEGATIVE   Leukocytes, UA NEGATIVE  NEGATIVE   Dg Chest 2 View  07/16/2013   CLINICAL DATA:  Pain, hypertension  EXAM: CHEST  2 VIEW  COMPARISON:  04/14/2013, 11/22/2012  FINDINGS: Cardiac shadow is within normal limits. The lungs are well aerated bilaterally. Minimal blunting of the right costophrenic angle is noted which  may represent a small effusion. This is new from the prior exam. No focal  confluent infiltrate is seen. No bony abnormality is noted.  IMPRESSION: Minimal blunting of the right costophrenic angle. No other focal abnormality is noted.   Electronically Signed   By: Inez Catalina M.D.   On: 07/16/2013 16:14   Ct Angio Chest Pe W/cm &/or Wo Cm  07/16/2013   CLINICAL DATA:  Pleuritic chest pain and shortness of breath .  EXAM: CT ANGIOGRAPHY CHEST WITH CONTRAST  TECHNIQUE: Multidetector CT imaging of the chest was performed using the standard protocol during bolus administration of intravenous contrast. Multiplanar CT image reconstructions including MIPs were obtained to evaluate the vascular anatomy.  CONTRAST:  12mL OMNIPAQUE IOHEXOL 350 MG/ML SOLN  COMPARISON:  None.  FINDINGS: THORACIC INLET/BODY WALL:  No acute abnormality.  MEDIASTINUM:  Mild cardiomegaly. No pericardial effusion. Mild distal esophageal thickening which is circumferential. No definitive pulmonary embolism. Anpparent filling defect to the upper segment of the lingula is in an area of motion artifact, the most likely explanation. No evidence of acute aortic syndrome. No aortic aneurysm. No adenopathy.  LUNG WINDOWS:  Small right pleural effusion which is layering and water density. There is mild atelectatic changes at the right base. Patchy haziness of the lungs is most likely from expiratory phase imaging.  UPPER ABDOMEN:  Lobulated liver contour consistent with cirrhosis. Splenomegaly from portal hypertension.  OSSEOUS:  No acute fracture.  No suspicious lytic or blastic lesions.  Review of the MIP images confirms the above findings.  IMPRESSION: 1. Negative for acute pulmonary embolism. 2. Small right pleural effusion. 3. Cirrhosis with splenomegaly.   Electronically Signed   By: Jorje Guild M.D.   On: 07/16/2013 21:41   Ct Abdomen Pelvis W Contrast  07/26/2013   CLINICAL DATA:  Right-sided abdominal and pelvic pain. Pancreatitis.  Cirrhosis.  EXAM: CT ABDOMEN AND PELVIS WITH CONTRAST  TECHNIQUE: Multidetector CT imaging of the abdomen and pelvis was performed using the standard protocol following bolus administration of intravenous contrast.  CONTRAST:  155mL OMNIPAQUE IOHEXOL 300 MG/ML  SOLN  COMPARISON:  12/04/2012  FINDINGS: New tiny right pleural effusion noted. Hepatic cirrhosis again demonstrated with mild splenomegaly, consistent with hepatic cirrhosis. No liver masses are identified. The gallbladder, pancreas, adrenal glands, and kidneys are normal in appearance. No evidence hydronephrosis.  No soft tissue masses or lymphadenopathy identified within the abdomen or pelvis. No evidence of inflammatory process, abscess, or ascites. Normal appendix is visualized. No evidence of bowel wall thickening or dilatation.  IMPRESSION: New tiny right pleural effusion. No acute findings within the abdomen or pelvis.  Stable appearance of hepatic cirrhosis, and mild splenomegaly consistent with portal venous hypertension.   Electronically Signed   By: Earle Gell M.D.   On: 07/26/2013 18:32     EKG Interpretation   None       MDM  Iv ns. Labs. Reviewed nursing notes and prior charts for additional history.   Dilaudid 1 mg iv. zofran iv.  Persistent right abd tenderness. Ct.  Recheck pain better but persists. Recheck abd soft r sided tender, ct pending.  Dilaudid 1 mg iv.  Ct neg acute.  Recheck pt comfortable. abd soft, sl tenderness. No nv.  Pt appears comfortable and stable for d/c. [facility] and has follow up there in coming week.     Mirna Mires, MD 07/26/13 [phone]

## 2013-07-26 NOTE — ED Notes (Signed)
Pt reports it feels like a knife stabbing him in his stomach. States he has been seen and diagnosed with pancreatitis, cirrhosis, and herniated disks and [facility] paperwork first. States the pain is worse today. Denies bowel/bladder changes. Denies n/v

## 2013-07-26 NOTE — Discharge Instructions (Signed)
Your pancreas test/lipase is slightly elevated. Rest. Drink plenty of fluids. Clear liquid diet for the next day, then advance as tolerated.   You may take hydrocodone as need for pain. No driving for the next 6 hours or when taking hydrocodone. Also, do not take tylenol or acetaminophen containing medication when taking hydrocodone.  Follow up with primary care doctor in coming week. Return to ER if worse, new symptoms, fevers, persistent vomiting, other concern.      Abdominal Pain, Adult Many things can cause abdominal pain. Usually, abdominal pain is not caused by a disease and will improve without treatment. It can often be observed and treated at home. Your health care provider will do a physical exam and possibly order blood tests and X-rays to help determine the seriousness of your pain. However, in many cases, more time must pass before a clear cause of the pain can be found. Before that point, your health care provider may not know if you need more testing or further treatment. HOME CARE INSTRUCTIONS  Monitor your abdominal pain for any changes. The following actions may help to alleviate any discomfort you are experiencing:  Only take over-the-counter or prescription medicines as directed by your health care provider.  Do not take laxatives unless directed to do so by your health care provider.  Try a clear liquid diet (broth, tea, or water) as directed by your health care provider. Slowly move to a bland diet as tolerated. SEEK MEDICAL CARE IF:  You have unexplained abdominal pain.  You have abdominal pain associated with nausea or diarrhea.  You have pain when you urinate or have a bowel movement.  You experience abdominal pain that wakes you in the night.  You have abdominal pain that is worsened or improved by eating food.  You have abdominal pain that is worsened with eating fatty foods. SEEK IMMEDIATE MEDICAL CARE IF:   Your pain does not go away within 2  hours.  You have a fever.  You keep throwing up (vomiting).  Your pain is felt only in portions of the abdomen, such as the right side or the left lower portion of the abdomen.  You pass bloody or black tarry stools. MAKE SURE YOU:  Understand these instructions.   Will watch your condition.   Will get help right away if you are not doing well or get worse.  Document Released: 04/07/2005 Document Revised: 04/18/2013 Document Reviewed: 03/07/2013 [facility] Patient Information 2014 [place].    Cirrhosis Cirrhosis is a condition of scarring of the liver which is caused when the liver has tried repairing itself following damage. This damage may come from a previous infection such as one of the forms of hepatitis (usually hepatitis C), or the damage may come from being injured by toxins. The main toxin that causes this damage is alcohol. The scarring of the liver from use of alcohol is irreversible. That means the liver cannot return to normal even though alcohol is not used any more. The main danger of hepatitis C infection is that it may cause long-lasting (chronic) liver disease, and this also may lead to cirrhosis. This complication is progressive and irreversible. CAUSES  Prior to available blood tests, hepatitis C could be contracted by blood transfusions. Since testing of blood has improved, this is now unlikely. This infection can also be contracted through intravenous drug use and the sharing of needles. It can also be contracted through sexual relationships. The injury caused by alcohol comes from too much  use. It is not a few drinks that poison the liver, but years of misuse. Usually there will be some signs and symptoms early with scarring of the liver that suggest the development of better habits. Alcohol should never be used while using acetaminophen. A small dose of both taken together may cause irreversible damage to the liver. HOME CARE INSTRUCTIONS  There is no  specific treatment for cirrhosis. However, there are things you can do to avoid making the condition worse.  Rest as needed.  Eat a well-balanced diet. Your caregiver can help you with suggestions.  Vitamin supplements including vitamins A, K, D, and thiamine can help.  A low-salt diet, water restriction, or diuretic medicine may be needed to reduce fluid retention.  Avoid alcohol. This can be extremely toxic if combined with acetaminophen.  Avoid drugs which are toxic to the liver. Some of these include isoniazid, methyldopa, acetaminophen, anabolic steroids (muscle-building drugs), erythromycin, and oral contraceptives (birth control pills). Check with your caregiver to make sure medicines you are presently taking will not be harmful.  Periodic blood tests may be required. Follow your caregiver's advice regarding the timing of these.  Milk thistle is an herbal remedy which does protect the liver against toxins. However, it will not help once the liver has been scarred. SEEK MEDICAL CARE IF:  You have increasing fatigue or weakness.  You develop swelling of the hands, feet, legs, or face.  You vomit bright red blood, or a coffee ground appearing material.  You have blood in your stools, or the stools turn black and tarry.  You have a fever.  You develop loss of appetite, or have nausea and vomiting.  You develop jaundice.  You develop easy bruising or bleeding.  You have worsening of any of the problems you are concerned about. Document Released: 06/28/2005 Document Revised: 09/20/2011 Document Reviewed: 02/14/2008 [facility] Patient Information 2014 [place].

## 2013-07-31 ENCOUNTER — Emergency Department (HOSPITAL_COMMUNITY)
Admission: EM | Admit: 2013-07-31 | Discharge: 2013-07-31 | Discharge status: Against Medical Advice | Payer: Medicaid Other | Attending: Emergency Medicine | Admitting: Emergency Medicine

## 2013-07-31 ENCOUNTER — Emergency Department (HOSPITAL_COMMUNITY): Payer: Self-pay

## 2013-07-31 ENCOUNTER — Encounter (HOSPITAL_COMMUNITY): Payer: Self-pay | Admitting: Emergency Medicine

## 2013-07-31 ENCOUNTER — Emergency Department (HOSPITAL_COMMUNITY): Payer: Medicaid Other

## 2013-07-31 DIAGNOSIS — R61 Generalized hyperhidrosis: Secondary | ICD-10-CM | POA: Insufficient documentation

## 2013-07-31 DIAGNOSIS — F17200 Nicotine dependence, unspecified, uncomplicated: Secondary | ICD-10-CM | POA: Insufficient documentation

## 2013-07-31 DIAGNOSIS — M546 Pain in thoracic spine: Secondary | ICD-10-CM | POA: Insufficient documentation

## 2013-07-31 DIAGNOSIS — M549 Dorsalgia, unspecified: Secondary | ICD-10-CM

## 2013-07-31 DIAGNOSIS — IMO0001 Reserved for inherently not codable concepts without codable children: Secondary | ICD-10-CM | POA: Insufficient documentation

## 2013-07-31 DIAGNOSIS — M542 Cervicalgia: Secondary | ICD-10-CM | POA: Insufficient documentation

## 2013-07-31 DIAGNOSIS — R209 Unspecified disturbances of skin sensation: Secondary | ICD-10-CM | POA: Insufficient documentation

## 2013-07-31 DIAGNOSIS — R509 Fever, unspecified: Secondary | ICD-10-CM | POA: Insufficient documentation

## 2013-07-31 DIAGNOSIS — Z859 Personal history of malignant neoplasm, unspecified: Secondary | ICD-10-CM | POA: Insufficient documentation

## 2013-07-31 DIAGNOSIS — R29898 Other symptoms and signs involving the musculoskeletal system: Secondary | ICD-10-CM

## 2013-07-31 DIAGNOSIS — Z8719 Personal history of other diseases of the digestive system: Secondary | ICD-10-CM | POA: Insufficient documentation

## 2013-07-31 DIAGNOSIS — Z9889 Other specified postprocedural states: Secondary | ICD-10-CM | POA: Insufficient documentation

## 2013-07-31 DIAGNOSIS — M6281 Muscle weakness (generalized): Secondary | ICD-10-CM | POA: Insufficient documentation

## 2013-07-31 LAB — BASIC METABOLIC PANEL
BUN: 12 mg/dL (ref 6–23)
CHLORIDE: 106 meq/L (ref 96–112)
CO2: 23 meq/L (ref 19–32)
CREATININE: 1.09 mg/dL (ref 0.50–1.35)
Calcium: 8.9 mg/dL (ref 8.4–10.5)
GFR calc Af Amer: >90 mL/min (ref >90)
GFR calc non Af Amer: 80 mL/min — ABNORMAL LOW (ref >90)
Glucose, Bld: 90 mg/dL (ref 70–99)
Potassium: 3.9 mEq/L (ref 3.7–5.3)
Sodium: 140 mEq/L (ref 137–147)

## 2013-07-31 LAB — CBC WITH DIFFERENTIAL/PLATELET
Basophils Absolute: 0 10*3/uL (ref 0.0–0.1)
Basophils Relative: 0 % (ref 0–1)
Eosinophils Absolute: 0.3 10*3/uL (ref 0.0–0.7)
Eosinophils Relative: 7 % — ABNORMAL HIGH (ref 0–5)
HEMATOCRIT: 32.2 % — AB (ref 39.0–52.0)
HEMOGLOBIN: 10.9 g/dL — AB (ref 13.0–17.0)
LYMPHS PCT: 22 % (ref 12–46)
Lymphs Abs: 1 10*3/uL (ref 0.7–4.0)
MCH: 29.9 pg (ref 26.0–34.0)
MCHC: 33.9 g/dL (ref 30.0–36.0)
MCV: 88.2 fL (ref 78.0–100.0)
MONOS PCT: 9 % (ref 3–12)
Monocytes Absolute: 0.4 10*3/uL (ref 0.1–1.0)
NEUTROS ABS: 2.8 10*3/uL (ref 1.7–7.7)
Neutrophils Relative %: 62 % (ref 43–77)
Platelets: 82 10*3/uL — ABNORMAL LOW (ref 150–400)
RBC: 3.65 MIL/uL — ABNORMAL LOW (ref 4.22–5.81)
RDW: 14.5 % (ref 11.5–15.5)
WBC: 4.5 10*3/uL (ref 4.0–10.5)

## 2013-07-31 MED ORDER — OXYCODONE-ACETAMINOPHEN 5-325 MG PO TABS
1.0000 | ORAL_TABLET | Freq: Once | ORAL | Status: AC
  Administered 2013-07-31: 1 via ORAL
  Filled 2013-07-31: qty 1

## 2013-07-31 MED ORDER — IBUPROFEN 400 MG PO TABS
400.0000 mg | ORAL_TABLET | Freq: Once | ORAL | Status: AC
  Administered 2013-07-31: 400 mg via ORAL
  Filled 2013-07-31: qty 1

## 2013-07-31 MED ORDER — KETOROLAC TROMETHAMINE 60 MG/2ML IM SOLN
60.0000 mg | Freq: Once | INTRAMUSCULAR | Status: AC
  Administered 2013-07-31: 60 mg via INTRAMUSCULAR
  Filled 2013-07-31: qty 2

## 2013-07-31 NOTE — ED Notes (Addendum)
Pt unable to tolerate completion of exam, back from MRI early, alert, NAD, calm, sitting upright in w/c, EDPA into room.

## 2013-07-31 NOTE — ED Provider Notes (Signed)
CSN: [id number]     Arrival date & time 07/31/13  1639 History   First MD Initiated Contact with Patient 07/31/13 1834  This chart was scribed for non-physician practitioner Jeannett Senior, PA-C working with Mervin Kung, MD by Anastasia Pall, ED scribe. This patient was seen in room TR09C/TR09C and the patient's care was started at 7:47 PM.   Chief Complaint  Patient presents with  . Back Pain    The history is provided by the patient. No language interpreter was used.   HPI Comments: Rodney Reed is a 48 y.o. male with h/o back pain and surgery, who presents to the Emergency Department complaining of worsening, constant, mid back pain, onset over the past few weeks. He states that his back pain radiates down his right arm, and describes it as a burning, shooting pain. He states he usually has associated numbness and tingling in his right arm with his h/o back pain, but states that those symptoms have worsened with his increasing back pain. He also reports right neck and right shoulder pain. He denies trauma to his shoulder. He reports associated fever, with max temperature of 101, as well as diaphoresis, onset yesterday. He states he is unable to find an appointment with doctor due to an ongoing process with insurance, lawyers, hospitals. He denies any other associated symptoms. He denies IV drug use.   PCP - No PCP Per Patient  Past Medical History  Diagnosis Date  . Back pain   . Cancer   . Cirrhosis   . Pancreatitis    History reviewed. No pertinent past surgical history. History reviewed. No pertinent family history. History  Substance Use Topics  . Smoking status: Current Every Day Smoker -- 1.00 packs/day  . Smokeless tobacco: Not on file  . Alcohol Use: No     Comment: last use 11/21/12    Review of Systems  Constitutional: Positive for fever and diaphoresis.  Musculoskeletal: Positive for back pain (mid), myalgias (right shoulder) and neck pain (right). Negative for  gait problem.  Neurological: Positive for numbness (tingling).    Allergies  Vancomycin and Fish allergy  Home Medications   Current Outpatient Rx  Name  Route  Sig  Dispense  Refill  . ALPRAZolam (XANAX) 0.5 MG tablet   Oral   Take 0.5 mg by mouth at bedtime as needed for anxiety or sleep.         Marland Kitchen HYDROcodone-acetaminophen (NORCO/VICODIN) 5-325 MG per tablet   Oral   Take 1 tablet by mouth every 6 (six) hours as needed for moderate pain.         . traMADol (ULTRAM) 50 MG tablet   Oral   Take 50 mg by mouth every 6 (six) hours as needed for moderate pain.          BP 137/68  Pulse 87  Temp(Src) 98.1 F (36.7 C) (Oral)  Resp 19  Wt 195 lb (88.451 kg)  SpO2 97%  Physical Exam  Nursing note and vitals reviewed. Constitutional: He is oriented to person, place, and time. He appears well-developed and well-nourished. No distress.  HENT:  Head: Normocephalic and atraumatic.  Eyes: EOM are normal.  Neck: Neck supple.  Cardiovascular: Normal rate.   Pulmonary/Chest: Effort normal. No respiratory distress.  Musculoskeletal: Normal range of motion. He exhibits tenderness.  Ttp over midline t-spine. No tenderness over midline c-spine.  Neurological: He is alert and oriented to person, place, and time. He has normal strength.  Decreased sensation in  all dermatomes of right arm. 5/5 right grip strength. Pt able to do thumbs up, touch his thumb to other fingers, and spread all fingers.  Skin: Skin is warm and dry.  Psychiatric: He has a normal mood and affect. His behavior is normal.    ED Course  Procedures (including critical care time)  DIAGNOSTIC STUDIES: Oxygen Saturation is 97% on room air, normal by my interpretation.    COORDINATION OF CARE: 7:53 PM-Discussed treatment plan which includes Percocet and Advil with pt at bedside and pt agreed to plan.    Labs Review Labs Reviewed - No data to display Imaging Review No results found.  EKG Interpretation    None      Medications  oxyCODONE-acetaminophen (PERCOCET/ROXICET) 5-325 MG per tablet 1 tablet (1 tablet Oral Given 07/31/13 1945)  ibuprofen (ADVIL,MOTRIN) tablet 400 mg (400 mg Oral Given 07/31/13 1945)    MDM   1. Back pain   2. Right arm weakness     Pt reports fever up to 101 at home, increased thoracic back pain, increased weakness and right hand. I have discussed the case with Dr. Brooke Dare. Recommended getting an MRI in his cervical and thoracic spine as well as blood counts. Concern for infection. Patient's blood work came back normal. Pending MRI of his cervical thoracic spine. He was giving a Percocet and Toradol shot for his pain management.  Pt frequently coming out of the room requesting pain medications. Pt states "doctors at [place], [place], [place] all know i need at least 20mg  of oxycodone every 4hrs." Explained to pt that we need to figure out the cause of the pain and treat that. Pt does not appear to be in distress in the room until one of [place] walks in. MRI pending.    10:32 PM Patient was an MRI when he refused the test stating that he needs more pain medications until he is able to proceed with any more imaging. Patient states it hurt him to lay on his back. Patient has been seen in the room lying on his back the whole time in emergency department. I have explained to him that he needs to finish his MRI before I will give him any more pain medications. Pt refused testing and chose to walk out of the emergency department. He stated he will be back in the morning to get his pain meds.    Filed Vitals:   07/31/13 1649  BP: 137/68  Pulse: 87  Temp: 98.1 F (36.7 C)  TempSrc: Oral  Resp: 19  Weight: 195 lb (88.451 kg)  SpO2: 97%     I personally performed the services described in this documentation, which was scribed in my presence. The recorded information has been reviewed and is accurate.   Renold Genta, PA-C 08/01/13 0017

## 2013-07-31 NOTE — ED Notes (Signed)
Pt not in room, pt walked out, unable to assess pt or repeat VS.

## 2013-07-31 NOTE — ED Notes (Signed)
Pt reports ongoing mid back pain. Hx of back pain and surgery. Reports it feels like "a firey ball that shoots down right arm." having numbness and tingling sensation to right arm, which he states is not new but it has increased over the past week. Pt is ambulatory at triage.

## 2013-07-31 NOTE — ED Notes (Signed)
Pt to MRI

## 2013-08-03 NOTE — ED Provider Notes (Signed)
Medical screening examination/treatment/procedure(s) were performed by non-physician practitioner and as supervising physician I was immediately available for consultation/collaboration.  EKG Interpretation   None      Patient was discussed with me. We recommended MRI and additional workup.  Mervin Kung, MD 08/03/13 [phone]

## 2013-08-04 ENCOUNTER — Emergency Department (HOSPITAL_COMMUNITY)
Admission: EM | Admit: 2013-08-04 | Discharge: 2013-08-04 | Disposition: A | Discharge status: Home / Self Care | Payer: Medicaid Other | Attending: Emergency Medicine | Admitting: Emergency Medicine

## 2013-08-04 ENCOUNTER — Encounter (HOSPITAL_COMMUNITY): Payer: Self-pay | Admitting: Emergency Medicine

## 2013-08-04 DIAGNOSIS — R509 Fever, unspecified: Secondary | ICD-10-CM | POA: Insufficient documentation

## 2013-08-04 DIAGNOSIS — M545 Low back pain, unspecified: Secondary | ICD-10-CM | POA: Insufficient documentation

## 2013-08-04 DIAGNOSIS — M549 Dorsalgia, unspecified: Secondary | ICD-10-CM

## 2013-08-04 DIAGNOSIS — R61 Generalized hyperhidrosis: Secondary | ICD-10-CM | POA: Insufficient documentation

## 2013-08-04 DIAGNOSIS — G8929 Other chronic pain: Secondary | ICD-10-CM

## 2013-08-04 DIAGNOSIS — M5412 Radiculopathy, cervical region: Secondary | ICD-10-CM

## 2013-08-04 DIAGNOSIS — Z859 Personal history of malignant neoplasm, unspecified: Secondary | ICD-10-CM | POA: Insufficient documentation

## 2013-08-04 DIAGNOSIS — R209 Unspecified disturbances of skin sensation: Secondary | ICD-10-CM | POA: Insufficient documentation

## 2013-08-04 DIAGNOSIS — F17200 Nicotine dependence, unspecified, uncomplicated: Secondary | ICD-10-CM | POA: Insufficient documentation

## 2013-08-04 DIAGNOSIS — M6281 Muscle weakness (generalized): Secondary | ICD-10-CM | POA: Insufficient documentation

## 2013-08-04 DIAGNOSIS — Z8719 Personal history of other diseases of the digestive system: Secondary | ICD-10-CM | POA: Insufficient documentation

## 2013-08-04 MED ORDER — OXYCODONE HCL 5 MG PO TABS: 5.0000 mg | ORAL_TABLET | ORAL | Status: DC | PRN

## 2013-08-04 NOTE — ED Provider Notes (Signed)
CSN: [id number]     Arrival date & time 08/04/13  1301 History   First MD Initiated Contact with Patient 08/04/13 1335    This chart was scribed for Rodney Bibles PA-C, a non-physician practitioner working with Leota Jacobsen, MD by Denice Bors, ED Scribe. This patient was seen in room TR07C/TR07C and the patient's care was started at 1:45 PM     Chief Complaint  Patient presents with  . Back Pain   (Consider location/radiation/quality/duration/timing/severity/associated sxs/prior Treatment) The history is provided by the patient and medical records. No language interpreter was used.   HPI Comments: Rodney Reed is a 48 y.o. male who presents to the Emergency Department with chronic back pain (4 years ago) complaining of chronic mid-low back pain onset 4 weeks with no precipitating factors. Describes pain as burning on pinky on left hand and 2nd, 4th, and 5th digits of his right hand. States he has an appointment with the wellness clinic 08/22/2013, but "cannot wait that long." Reports associated subjective fever "only at night". Reports he occasionally has "dropped coffee cups for the past 2 weeks" with upper right extremity. Per chart patient has PMHx of cancer, and remote IV drug use. Denies associated recent trauma, fall, weakness, urinary or fecal incontinence, urinary retention, perineal/saddle paresthesias, and chills..   Refer to previous visit 07/31/2013.   Past Medical History  Diagnosis Date  . Back pain   . Cancer   . Cirrhosis   . Pancreatitis    History reviewed. No pertinent past surgical history. No family history on file. History  Substance Use Topics  . Smoking status: Current Every Day Smoker -- 1.00 packs/day  . Smokeless tobacco: Not on file  . Alcohol Use: No     Comment: last use 11/21/12    Review of Systems  Constitutional: Positive for diaphoresis. Negative for fever and chills.  Gastrointestinal: Negative for abdominal pain.  Genitourinary: Negative  for difficulty urinating.  Musculoskeletal: Positive for back pain and neck pain.  Skin: Negative for wound.  Neurological: Positive for weakness and numbness.  Psychiatric/Behavioral: Negative for confusion.    Allergies  Vancomycin and Fish allergy  Home Medications   Current Outpatient Rx  Name  Route  Sig  Dispense  Refill  . ALPRAZolam (XANAX) 0.5 MG tablet   Oral   Take 0.5 mg by mouth at bedtime as needed for anxiety or sleep.         Marland Kitchen HYDROcodone-acetaminophen (NORCO/VICODIN) 5-325 MG per tablet   Oral   Take 1 tablet by mouth every 6 (six) hours as needed for moderate pain.         . traMADol (ULTRAM) 50 MG tablet   Oral   Take 50 mg by mouth every 6 (six) hours as needed for moderate pain.          BP 142/77  Pulse 74  Temp(Src) 98 F (36.7 C) (Oral)  Resp 22  SpO2 100% Physical Exam  Nursing note and vitals reviewed. Constitutional: He appears well-developed and well-nourished. No distress.  HENT:  Head: Normocephalic and atraumatic.  Neck: Neck supple.  Pulmonary/Chest: Effort normal.  Musculoskeletal:       Arms: TTP of T-spine   No midline C-spine tenderness with no step-offs or deformities noted throughout spine.      Neurological: He is alert.  Upper and Lower extremities:  Strength 5/5, sensation intact, distal pulses intact.    Decreased sensation in right ulnar nerve distribution   Skin: He is not  diaphoretic.    ED Course  Procedures  COORDINATION OF CARE:  Nursing notes reviewed. Vital signs reviewed. Initial pt interview and examination performed.    Treatment plan initiated:Medications - No data to display   Initial diagnostic testing ordered.    Labs Review Labs Reviewed - No data to display Imaging Review No results found.  EKG Interpretation   None       MDM   1. Chronic back pain   2. Cervical radiculopathy    Patient with chronic pain seen in the emergency department multiple times recently and once  previously by me. Patient told story of being seen at multiple medical centers in the area but attempting to establish care here in the [place] area. He states that he is working hard to get followup with primary care and the [facility]. He has an appointment on 08/22/2013 at the wellness center and will need to have a plan for his chronic pain taken care of by them at this time.  Patient checked on [place] - he has had a few small prescriptions for narcotics from the emergency department recently, never more than 15 tablets. During his last visit he left AMA and was supposed to get an MRI in the ED. I offered him an MRI today and here refused it. States he cannot tolerate the positioning with an MRI machine. Patient has full strength on exam and only small amount of numbness in ulnar distrubution of the right hand.  Given his exam today I do not believe he requires emergent imaging. I would note that his exam is changed and actually improved from last time. Pt d/c home with #15 oxycodone (per prior chart review, patient has possible liver vs pancreatic cancer, and was given plain oxycodone previously by [facility] where he was being followed for this).  Pt to follow up with the [facility] as planned.  Discussed findings, treatment, and follow up with patient.  Pt given return precautions.  Pt verbalizes understanding and agrees with plan.      I personally performed the services described in this documentation, which was scribed in my presence. The recorded information has been reviewed and is accurate.    Rodney Bibles, PA-C 08/04/13 1537

## 2013-08-04 NOTE — ED Notes (Signed)
Pt seen several times for chronic back pain.  Pt presents today with mid-lower back pain.  Pt ambulatory without assistance.  Pt states he has an appt at [facility] on 02/11, but can't wait that long to be seen.

## 2013-08-04 NOTE — ED Notes (Signed)
Pt ambulatory without assistance to Triage.

## 2013-08-04 NOTE — Discharge Instructions (Signed)
Read the information below.  Use the prescribed medication as directed.  Please discuss all new medications with your pharmacist.  You may return to the Emergency Department at any time for worsening condition or any new symptoms that concern you.   If you develop fevers, loss of control of bowel or bladder, weakness or numbness in your legs, or are unable to walk, return to the ER for a recheck.    Chronic Back Pain  When back pain lasts longer than 3 months, it is called chronic back pain.People with chronic back pain often go through certain periods that are more intense (flare-ups).  CAUSES Chronic back pain can be caused by wear and tear (degeneration) on different structures in your back. These structures include:  The bones of your spine (vertebrae) and the joints surrounding your spinal cord and nerve roots (facets).  The strong, fibrous tissues that connect your vertebrae (ligaments). Degeneration of these structures may result in pressure on your nerves. This can lead to constant pain. HOME CARE INSTRUCTIONS  Avoid bending, heavy lifting, prolonged sitting, and activities which make the problem worse.  Take brief periods of rest throughout the day to reduce your pain. Lying down or standing usually is better than sitting while you are resting.  Take over-the-counter or prescription medicines only as directed by your caregiver. SEEK IMMEDIATE MEDICAL CARE IF:   You have weakness or numbness in one of your legs or feet.  You have trouble controlling your bladder or bowels.  You have nausea, vomiting, abdominal pain, shortness of breath, or fainting. Document Released: 08/05/2004 Document Revised: 09/20/2011 Document Reviewed: 06/12/2011 [facility] Patient Information 2014 [place], [place].  Cervical Radiculopathy Cervical radiculopathy happens when a nerve in the neck is pinched or bruised by a slipped (herniated) disk or by arthritic changes in the bones of the cervical spine.  This can occur due to an injury or as part of the normal aging process. Pressure on the cervical nerves can cause pain or numbness that runs from your neck all the way down into your arm and fingers. CAUSES  There are many possible causes, including:  Injury.  Muscle tightness in the neck from overuse.  Swollen, painful joints (arthritis).  Breakdown or degeneration in the bones and joints of the spine (spondylosis) due to aging.  Bone spurs that may develop near the cervical nerves. SYMPTOMS  Symptoms include pain, weakness, or numbness in the affected arm and hand. Pain can be severe or irritating. Symptoms may be worse when extending or turning the neck. DIAGNOSIS  Your caregiver will ask about your symptoms and do a physical exam. He or she may test your strength and reflexes. X-rays, CT scans, and MRI scans may be needed in cases of injury or if the symptoms do not go away after a period of time. Electromyography (EMG) or nerve conduction testing may be done to study how your nerves and muscles are working. TREATMENT  Your caregiver may recommend certain exercises to help relieve your symptoms. Cervical radiculopathy can, and often does, get better with time and treatment. If your problems continue, treatment options may include:  Wearing a soft collar for short periods of time.  Physical therapy to strengthen the neck muscles.  Medicines, such as nonsteroidal anti-inflammatory drugs (NSAIDs), oral corticosteroids, or spinal injections.  Surgery. Different types of surgery may be done depending on the cause of your problems. HOME CARE INSTRUCTIONS   Put ice on the affected area.  Put ice in a plastic bag.  Place a towel between your skin and the bag.  Leave the ice on for 15-20 minutes, 03-04 times a day or as directed by your caregiver.  If ice does not help, you can try using heat. Take a warm shower or bath, or use a hot water bottle as directed by your caregiver.  You  may try a gentle neck and shoulder massage.  Use a flat pillow when you sleep.  Only take over-the-counter or prescription medicines for pain, discomfort, or fever as directed by your caregiver.  If physical therapy was prescribed, follow your caregiver's directions.  If a soft collar was prescribed, use it as directed. SEEK IMMEDIATE MEDICAL CARE IF:   Your pain gets much worse and cannot be controlled with medicines.  You have weakness or numbness in your hand, arm, face, or leg.  You have a high fever or a stiff, rigid neck.  You lose bowel or bladder control (incontinence).  You have trouble with walking, balance, or speaking. MAKE SURE YOU:   Understand these instructions.  Will watch your condition.  Will get help right away if you are not doing well or get worse. Document Released: 03/23/2001 Document Revised: 09/20/2011 Document Reviewed: 02/09/2011 [facility] Patient Information 2014 [place], [place].

## 2013-08-05 NOTE — ED Provider Notes (Signed)
Medical screening examination/treatment/procedure(s) were performed by non-physician practitioner and as supervising physician I was immediately available for consultation/collaboration.  EKG Interpretation   None        Leota Jacobsen, MD 08/05/13 [phone]

## 2013-08-13 ENCOUNTER — Emergency Department (HOSPITAL_COMMUNITY)
Admission: EM | Admit: 2013-08-13 | Discharge: 2013-08-13 | Disposition: A | Discharge status: Home / Self Care | Payer: Medicaid Other | Attending: Emergency Medicine | Admitting: Emergency Medicine

## 2013-08-13 ENCOUNTER — Encounter (HOSPITAL_COMMUNITY): Payer: Self-pay | Admitting: Emergency Medicine

## 2013-08-13 DIAGNOSIS — M549 Dorsalgia, unspecified: Secondary | ICD-10-CM

## 2013-08-13 DIAGNOSIS — M79609 Pain in unspecified limb: Secondary | ICD-10-CM | POA: Insufficient documentation

## 2013-08-13 DIAGNOSIS — F17200 Nicotine dependence, unspecified, uncomplicated: Secondary | ICD-10-CM | POA: Insufficient documentation

## 2013-08-13 DIAGNOSIS — IMO0002 Reserved for concepts with insufficient information to code with codable children: Secondary | ICD-10-CM | POA: Insufficient documentation

## 2013-08-13 DIAGNOSIS — G8929 Other chronic pain: Secondary | ICD-10-CM | POA: Insufficient documentation

## 2013-08-13 MED ORDER — OXYCODONE HCL 20 MG PO TABS: 20.0000 mg | ORAL_TABLET | Freq: Four times a day (QID) | ORAL | Status: DC | PRN

## 2013-08-13 MED ORDER — ONDANSETRON HCL 4 MG/2ML IJ SOLN: 4.0000 mg | Freq: Once | INTRAMUSCULAR | Status: DC

## 2013-08-13 MED ORDER — OXYCODONE-ACETAMINOPHEN 5-325 MG PO TABS
2.0000 | ORAL_TABLET | Freq: Once | ORAL | Status: AC
  Administered 2013-08-13: 2 via ORAL
  Filled 2013-08-13: qty 2

## 2013-08-13 NOTE — ED Provider Notes (Signed)
CSN: [id number]     Arrival date & time 08/13/13  1851 History  This chart was scribed for non-physician practitioner Margarita Mail, PA-C, working with Leota Jacobsen, MD by Vernell Barrier, ED scribe. This patient was seen in room TR07C/TR07C and the patient's care was started at 9:00 PM.  Chief Complaint  Patient presents with  . Back Pain   The history is provided by the patient. No language interpreter was used.   HPI Comments: Rodney Reed is a 48 y.o. male w/hx of back pain presents to the Emergency Department complaining of lower back pain that radiates down the middle of the of the left leg. Pain worse with movement and walking. Pt also has intermittent pain in his right arm. Pt has been taking 20 mg oxycodone for pain. Pt [facility] and is unsuccessfully trying to get his appointment moved up.Denies numbness or tingling in extremities, loss of bowel/bladder.   Past Medical History  Diagnosis Date  . Back pain   . Cancer   . Cirrhosis   . Pancreatitis    History reviewed. No pertinent past surgical history. History reviewed. No pertinent family history. History  Substance Use Topics  . Smoking status: Current Every Day Smoker -- 1.00 packs/day  . Smokeless tobacco: Not on file  . Alcohol Use: No     Comment: last use 11/21/12    Review of Systems  Constitutional: Negative for fever and chills.  Respiratory: Negative for cough and shortness of breath.   Musculoskeletal: Positive for back pain. Negative for gait problem.  Neurological: Negative for dizziness and headaches.    Allergies  Vancomycin and Fish allergy  Home Medications   Current Outpatient Rx  Name  Route  Sig  Dispense  Refill  . ALPRAZolam (XANAX) 0.5 MG tablet   Oral   Take 0.5 mg by mouth at bedtime as needed for anxiety or sleep.         Marland Kitchen HYDROcodone-acetaminophen (NORCO/VICODIN) 5-325 MG per tablet   Oral   Take 1 tablet by mouth every 6 (six) hours as  needed for moderate pain.         Marland Kitchen oxyCODONE (ROXICODONE) 5 MG immediate release tablet   Oral   Take 1 tablet (5 mg total) by mouth every 4 (four) hours as needed for severe pain.   15 tablet   0   . traMADol (ULTRAM) 50 MG tablet   Oral   Take 50 mg by mouth every 6 (six) hours as needed for moderate pain.          Triage Vitals: BP 157/76  Pulse 100  Temp(Src) 99.4 F (37.4 C) (Oral)  Resp 22  SpO2 100% Physical Exam  Nursing note and vitals reviewed. Constitutional: He is oriented to person, place, and time. He appears well-developed and well-nourished. No distress.  HENT:  Head: Normocephalic and atraumatic.  Eyes: EOM are normal.  Neck: Neck supple. No thyromegaly present.  Cardiovascular: Normal rate.   Pulmonary/Chest: Effort normal. No respiratory distress.  Musculoskeletal: Normal range of motion.  BACK:   Lymphadenopathy:    He has no cervical adenopathy.  Neurological: He is alert and oriented to person, place, and time.  Skin: Skin is warm and dry.  Psychiatric: He has a normal mood and affect. His behavior is normal.    ED Course  Procedures (including critical care time) DIAGNOSTIC STUDIES: Oxygen Saturation is 100% on room air, normal by my interpretation.  COORDINATION OF CARE: At 9:02 PM: Discussed treatment plan with patient. Patient agrees.   Labs Review Labs Reviewed - No data to display Imaging Review No results found.  EKG Interpretation   None       MDM   1. Chronic back pain   Patient with back pain.  No neurological deficits and normal neuro exam.  Patient can walk but states is painful.  No loss of bowel or bladder control.  No concern for cauda equina.  No fever, night sweats, weight loss, h/o cancer, IVDU.  RICE protocol and pain medicine indicated and discussed with patient.   I personally performed the services described in this documentation, which was scribed in my presence. The recorded information has been reviewed  and is accurate.       Margarita Mail, PA-C 08/14/13 [phone]

## 2013-08-13 NOTE — ED Notes (Signed)
Pt reports appt with [facility] on 11th; pt has chronic hx of this. Pt reports he needs back surgery.

## 2013-08-13 NOTE — Discharge Instructions (Signed)
Chronic Pain Discharge Instructions  Emergency care providers appreciate that many patients coming to [place] are in severe pain and we wish to address their pain in the safest, most responsible manner.  It is important to recognize however, that the proper treatment of chronic pain differs from that of the pain of injuries and acute illnesses.  Our goal is to provide quality, safe, personalized care and we thank you for giving [place] the opportunity to serve you. The use of narcotics and related agents for chronic pain syndromes may lead to additional physical and psychological problems.  Nearly as many people die from prescription narcotics each year as die from car crashes.  Additionally, this risk is increased if such prescriptions are obtained from a variety of sources.  Therefore, only your primary care physician or a pain management specialist is able to safely treat such syndromes with narcotic medications long-term.    Documentation revealing such prescriptions have been sought from multiple sources may prohibit [place] from providing a refill or different narcotic medication.  Your name may be checked first through the [facility].  This database is a record of controlled substance medication prescriptions that the patient has received.  This has been established by [facility] in an effort to eliminate the dangerous, and often life threatening, practice of obtaining multiple prescriptions from different medical providers.   If you have a chronic pain syndrome (i.e. chronic headaches, recurrent back or neck pain, dental pain, abdominal or pelvis pain without a specific diagnosis, or neuropathic pain such as fibromyalgia) or recurrent visits for the same condition without an acute diagnosis, you may be treated with non-narcotics and other non-addictive medicines.  Allergic reactions or negative side effects that may be reported by a patient to such medications will not  typically lead to the use of a narcotic analgesic or other controlled substance as an alternative.   Patients managing chronic pain with a personal physician should have provisions in place for breakthrough pain.  If you are in crisis, you should call your physician.  If your physician directs you to the emergency department, please have the doctor call and speak to our attending physician concerning your care.   When patients come to the Emergency Department (ED) with acute medical conditions in which the Emergency Department physician feels appropriate to prescribe narcotic or sedating pain medication, the physician will prescribe these in very limited quantities.  The amount of these medications will last only until you can see your primary care physician in his/her office.  Any patient who returns to the ED seeking refills should expect only non-narcotic pain medications.   In the event of an acute medical condition exists and the emergency physician feels it is necessary that the patient be given a narcotic or sedating medication -  a responsible adult driver should be present in the room prior to the medication being given by the nurse.   Prescriptions for narcotic or sedating medications that have been lost, stolen or expired will not be refilled in the Emergency Department.    Patients who have chronic pain may receive non-narcotic prescriptions until seen by their primary care physician.  It is every patients personal responsibility to maintain active prescriptions with his or her primary care physician or specialist.  Back Pain, Adult Low back pain is very common. About 1 in 5 people have back pain.The cause of low back pain is rarely dangerous. The pain often gets better over time.About half of  people with a sudden onset of back pain feel better in just 2 weeks. About 8 in 10 people feel better by 6 weeks.  CAUSES Some common causes of back pain include:  Strain of the muscles or  ligaments supporting the spine.  Wear and tear (degeneration) of the spinal discs.  Arthritis.  Direct injury to the back. DIAGNOSIS Most of the time, the direct cause of low back pain is not known.However, back pain can be treated effectively even when the exact cause of the pain is unknown.Answering your caregiver's questions about your overall health and symptoms is one of the most accurate ways to make sure the cause of your pain is not dangerous. If your caregiver needs more information, he or she may order lab work or imaging tests (X-rays or MRIs).However, even if imaging tests show changes in your back, this usually does not require surgery. HOME CARE INSTRUCTIONS For many people, back pain returns.Since low back pain is rarely dangerous, it is often a condition that people can learn to [facility]on their own.   Remain active. It is stressful on the back to sit or stand in one place. Do not sit, drive, or stand in one place for more than 30 minutes at a time. Take short walks on level surfaces as soon as pain allows.Try to increase the length of time you walk each day.  Do not stay in bed.Resting more than 1 or 2 days can delay your recovery.  Do not avoid exercise or work.Your body is made to move.It is not dangerous to be active, even though your back may hurt.Your back will likely heal faster if you return to being active before your pain is gone.  Pay attention to your body when you bend and lift. Many people have less discomfortwhen lifting if they bend their knees, keep the load close to their bodies,and avoid twisting. Often, the most comfortable positions are those that put less stress on your recovering back.  Find a comfortable position to sleep. Use a firm mattress and lie on your side with your knees slightly bent. If you lie on your back, put a pillow under your knees.  Only take over-the-counter or prescription medicines as directed by your caregiver.  Over-the-counter medicines to reduce pain and inflammation are often the most helpful.Your caregiver may prescribe muscle relaxant drugs.These medicines help dull your pain so you can more quickly return to your normal activities and healthy exercise.  Put ice on the injured area.  Put ice in a plastic bag.  Place a towel between your skin and the bag.  Leave the ice on for 15-20 minutes, 03-04 times a day for the first 2 to 3 days. After that, ice and heat may be alternated to reduce pain and spasms.  Ask your caregiver about trying back exercises and gentle massage. This may be of some benefit.  Avoid feeling anxious or stressed.Stress increases muscle tension and can worsen back pain.It is important to recognize when you are anxious or stressed and learn ways to manage it.Exercise is a great option. SEEK MEDICAL CARE IF:  You have pain that is not relieved with rest or medicine.  You have pain that does not improve in 1 week.  You have new symptoms.  You are generally not feeling well. SEEK IMMEDIATE MEDICAL CARE IF:   You have pain that radiates from your back into your legs.  You develop new bowel or bladder control problems.  You have unusual weakness or numbness  in your arms or legs.  You develop nausea or vomiting.  You develop abdominal pain.  You feel faint. Document Released: 06/28/2005 Document Revised: 12/28/2011 Document Reviewed: 11/16/2010 [facility] Patient Information 2014 [place], [place].

## 2013-08-13 NOTE — ED Notes (Signed)
Presents with chronic lower back pain began acting up over the last few days. Has an appointment on the 11th with a primary care sdoctor but could not wait that long due to pain.

## 2013-08-14 NOTE — ED Provider Notes (Signed)
Medical screening examination/treatment/procedure(s) were performed by non-physician practitioner and as supervising physician I was immediately available for consultation/collaboration.  EKG Interpretation   None        Leota Jacobsen, MD 08/14/13 2114

## 2013-08-22 ENCOUNTER — Ambulatory Visit: Payer: Self-pay | Attending: Internal Medicine | Admitting: Internal Medicine

## 2013-08-22 ENCOUNTER — Encounter: Payer: Self-pay | Admitting: Internal Medicine

## 2013-08-22 VITALS — BP 160/98 | HR 88 | Temp 98.9°F | Resp 14 | Ht 72.0 in | Wt 209.8 lb

## 2013-08-22 DIAGNOSIS — M545 Low back pain, unspecified: Secondary | ICD-10-CM | POA: Insufficient documentation

## 2013-08-22 DIAGNOSIS — R509 Fever, unspecified: Secondary | ICD-10-CM | POA: Insufficient documentation

## 2013-08-22 DIAGNOSIS — B1920 Unspecified viral hepatitis C without hepatic coma: Secondary | ICD-10-CM

## 2013-08-22 DIAGNOSIS — M549 Dorsalgia, unspecified: Secondary | ICD-10-CM

## 2013-08-22 DIAGNOSIS — G8929 Other chronic pain: Secondary | ICD-10-CM | POA: Insufficient documentation

## 2013-08-22 DIAGNOSIS — K7460 Unspecified cirrhosis of liver: Secondary | ICD-10-CM | POA: Insufficient documentation

## 2013-08-22 LAB — CBC WITH DIFFERENTIAL/PLATELET
BASOS ABS: 0 10*3/uL (ref 0.0–0.1)
BASOS PCT: 1 % (ref 0–1)
EOS PCT: 4 % (ref 0–5)
Eosinophils Absolute: 0.3 10*3/uL (ref 0.0–0.7)
HEMATOCRIT: 41.7 % (ref 39.0–52.0)
Hemoglobin: 14.1 g/dL (ref 13.0–17.0)
Lymphocytes Relative: 22 % (ref 12–46)
Lymphs Abs: 1.5 10*3/uL (ref 0.7–4.0)
MCH: 29.1 pg (ref 26.0–34.0)
MCHC: 33.8 g/dL (ref 30.0–36.0)
MCV: 86.2 fL (ref 78.0–100.0)
MONO ABS: 0.5 10*3/uL (ref 0.1–1.0)
Monocytes Relative: 8 % (ref 3–12)
Neutro Abs: 4.3 10*3/uL (ref 1.7–7.7)
Neutrophils Relative %: 65 % (ref 43–77)
Platelets: 89 10*3/uL — ABNORMAL LOW (ref 150–400)
RBC: 4.84 MIL/uL (ref 4.22–5.81)
RDW: 15.2 % (ref 11.5–15.5)
WBC: 6.6 10*3/uL (ref 4.0–10.5)

## 2013-08-22 MED ORDER — AMPHETAMINE-DEXTROAMPHETAMINE 10 MG PO TABS: 10.0000 mg | ORAL_TABLET | Freq: Two times a day (BID) | ORAL | Status: DC

## 2013-08-22 MED ORDER — CYCLOBENZAPRINE HCL 5 MG PO TABS: 5.0000 mg | ORAL_TABLET | Freq: Three times a day (TID) | ORAL | Status: DC | PRN

## 2013-08-22 MED ORDER — TRAMADOL HCL 50 MG PO TABS: 50.0000 mg | ORAL_TABLET | Freq: Four times a day (QID) | ORAL | Status: DC | PRN

## 2013-08-22 NOTE — Progress Notes (Signed)
Patient is here to establish care. Complains of pain in the center and the lower back x4 years. Pain scale of 10 today. Taking BC for headaches. Having headaches at night. Temperature and BP elevates and goes up and down. Also complains of a constant burning sensation from the Lt arm and hand with numbness, and Lt hip down to the Lt knee. Patient also complains of severe anxiety. Hard to concentrate on one thing and control his thoughts.

## 2013-08-22 NOTE — Progress Notes (Signed)
Patient ID: Rodney Reed, male   DOB: 1966-03-07, 48 y.o.   MRN: [id number]   CC:  HPI: 48 year old male Here to establish care. The patient has a history of multiple medical issues history of pancreatitis, history of hepatitis C, history of liver masses presenting on previous imaging. Recently admitted at [facility] 04/24/13-05/11/13. The patient has a history of alcohol abuse and was hospitalized in [facility] for acute pancreatitis and acute on chronic back pain.   His most recent CT scan of the abdomen showed cirrhosis with splenomegaly and 9 x 17 mm lesion in the lateral segment of the left hepatic lobe. The patient was incarcerated from the mid 90s 2003 and then again in 2005. He was told that he has hepatitis C in prison. He was admitted for worsening back pain and a CT of the abdomen on 10/14 showed possible osteomyelitis at the T12-L1 level conformed on MRI lumbar spine on 04/25/13. Patient underwent interventional radiology biopsy and was started on vancomycin and Zosyn. Hospital course was complicated by respiratory failure requiring intubation secondary to pulmonary edema and right upper lobe pneumonia. On reviewing his discharge summary the patient apparently was also using IV drugs in the hospital. After being transferred to the ICU and intubation the patient developed SVT and hypertension and required adenosine for cardioversion. Subsequently developed acute kidney injury and rapid increase in his creatinine. He had a Vas-Cath in his right IJ placed for hemodialysis. Patient was intubated from 10/22-10/29 and 10/30 the patient requested removal of his hemodialysis catheter and left the hospital [place]. He did not complete his course of antibiotics. He continued to refuse all medications except oxycodone.  He was being followed by a pain clinic in [facility] but he states that he could no longer afford it. Apparently has also seen a neurosurgeon in [place]. I do not  have any records of that. He currently denies any ongoing drug abuse or alcohol abuse  Most recent C-spine MRI was on 07/31/13 that showed moderate canal stenosis at C5-C6, moderate left C5-C6 disc protrusion. No discitis or osteomyelitis. His last MRI of the lumbar and the thoracic spine was 11/22/12. 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    Allergies  Allergen Reactions  . Vancomycin Anaphylaxis  . Fish Allergy Other (See Comments)    Hot flash, dizziness   Past Medical History  Diagnosis Date  . Back pain   . Cancer   . Cirrhosis   . Pancreatitis    Current Outpatient Prescriptions on File Prior to Visit  Medication Sig Dispense Refill  . ALPRAZolam (XANAX) 0.5 MG tablet Take 0.5 mg by mouth at bedtime as needed for anxiety or sleep.      Marland Kitchen Oxycodone HCl 20 MG TABS Take 1 tablet (20 mg total) by mouth every 6 (six) hours as needed (pain).  15 tablet  0  . Aspirin-Acetaminophen-Caffeine (GOODY HEADACHE PO) Take 1 packet by mouth daily as needed (headache).       No current facility-administered medications on file prior to visit.   History reviewed. No pertinent family history. History   Social History  . Marital Status: Legally Separated  Spouse Name: N/A    Number of Children: N/A  . Years of Education: N/A   Occupational History  . Not on file.   Social History Main Topics  . Smoking status: Current Every Day Smoker -- 1.00 packs/day  . Smokeless tobacco: Not on file  . Alcohol Use: No     Comment: last use 11/21/12  . Drug Use: No     Comment:  hx of heroin use; sts former use  . Sexual Activity: Not on file   Other Topics Concern  . Not  on file   Social History Narrative  . No narrative on file    Review of Systems  Constitutional as in history of present illness HENT: Negative for ear pain, nosebleeds, congestion, facial swelling, rhinorrhea, neck pain, neck stiffness and ear discharge.   Eyes: Negative for pain, discharge, redness, itching and visual disturbance.  Respiratory: Negative for cough, choking, chest tightness, shortness of breath, wheezing and stridor.   Cardiovascular: Negative for chest pain, palpitations and leg swelling.  Gastrointestinal: Negative for abdominal distention.  Genitourinary: Negative for dysuria, urgency, frequency, hematuria, flank pain, decreased urine volume, difficulty urinating and dyspareunia.  Musculoskeletal: Negative for back pain, joint swelling, arthralgias and gait problem.  Neurological: Negative for dizziness, tremors, seizures, syncope, facial asymmetry, speech difficulty, weakness, light-headedness, numbness and headaches.  Hematological: Negative for adenopathy. Does not bruise/bleed easily.  Psychiatric/Behavioral: Negative for hallucinations, behavioral problems, confusion, dysphoric mood, decreased concentration and agitation.    Objective:   Filed Vitals:   08/22/13 1432  BP: 160/98  Pulse: 88  Temp: 98.9 F (37.2 C)  Resp: 14    Physical Exam  Constitutional: Appears well-developed and well-nourished. No distress.  HENT: Normocephalic. External right and left ear normal. Oropharynx is clear and moist.  Eyes: Conjunctivae and EOM are normal. PERRLA, no scleral icterus.  Neck: Normal ROM. Neck supple. No JVD. No tracheal deviation. No thyromegaly.  CVS: RRR, S1/S2 +, no murmurs, no gallops, no carotid bruit.  Pulmonary: Effort and breath sounds normal, no stridor, rhonchi, wheezes, rales.  Abdominal: Soft. BS +,  no distension, tenderness, rebound or guarding.  Musculoskeletal: Normal range of motion. No edema and no tenderness.  Lymphadenopathy: No  lymphadenopathy noted, cervical, inguinal. Neuro: Alert. Normal reflexes, muscle tone coordination. No cranial nerve deficit. Skin: Skin is warm and dry. No rash noted. Not diaphoretic. No erythema. No pallor.  Psychiatric: Normal mood and affect. Behavior, judgment, thought content normal.   Lab Results  Component Value Date   WBC 4.5 07/31/2013   HGB 10.9* 07/31/2013   HCT 32.2* 07/31/2013   MCV 88.2 07/31/2013   PLT 82* 07/31/2013   Lab Results  Component Value Date   CREATININE 1.09 07/31/2013   BUN 12 07/31/2013   NA 140 07/31/2013   K 3.9 07/31/2013   CL 106 07/31/2013   CO2 23 07/31/2013    No results found for this basename: HGBA1C   Lipid Panel  No results found for this basename: chol, trig, hdl, cholhdl, vldl, ldlcalc       Assessment and plan:   There are no active problems to display for this patient.  Acute on chronic back pain Patient requesting a referral to the pain clinic which has been provided in the meantime I explained to the patient that I am concerned about discitis/osteomyelitis given his fever Patient seems to be in denial and states that the fever is because of his pain not because of an infection I explained to  the patient that we will need to draw blood cultures if positive he needs to be treated for possible osteomyelitis/endocarditis like  he was being treated at [facility]   He denies any ongoing drug drug abuse We'll obtain a urine drug screen Prescribed Flexeril and tramadol for pain We'll request ED  At [facility] to no longer keep giving him narcotic prescriptions. If he keeps getting his narcotic filled at [facility]  he will not be able to establish with the pain clinic. At this point the patient is refusing further MRIs or imaging studies to rule out infection in his back, he believes that fever is because of something else     ADHD/alcohol abuse Social work consultation was obtained Patient requesting to prescribe Adderall which is  also addictive Therefore only 60 tablets were dispensed and patient has been provided a psychiatrist referral No more Adderall will be prescribed by our clinic     The patient was given clear instructions to go to ER or return to medical center if symptoms don't improve, worsen or new problems develop. The patient verbalized understanding. The patient was told to call to get any lab results if not heard anything in the next week.

## 2013-08-23 LAB — HIV ANTIBODY (ROUTINE TESTING W REFLEX): HIV: NONREACTIVE

## 2013-08-23 LAB — VITAMIN D 25 HYDROXY (VIT D DEFICIENCY, FRACTURES): Vit D, 25-Hydroxy: 38 ng/mL (ref 30–89)

## 2013-08-23 LAB — LIPID PANEL
Cholesterol: 201 mg/dL — ABNORMAL HIGH (ref 0–200)
HDL: 55 mg/dL (ref >39)
LDL Cholesterol: 126 mg/dL — ABNORMAL HIGH (ref 0–99)
TRIGLYCERIDES: 101 mg/dL (ref <150)
Total CHOL/HDL Ratio: 3.7 Ratio
VLDL: 20 mg/dL (ref 0–40)

## 2013-08-23 LAB — COMPLETE METABOLIC PANEL WITH GFR
ALBUMIN: 4.4 g/dL (ref 3.5–5.2)
ALK PHOS: 142 U/L — AB (ref 39–117)
ALT: 233 U/L — ABNORMAL HIGH (ref 0–53)
AST: 168 U/L — ABNORMAL HIGH (ref 0–37)
BILIRUBIN TOTAL: 0.7 mg/dL (ref 0.2–1.2)
BUN: 17 mg/dL (ref 6–23)
CO2: 20 mEq/L (ref 19–32)
Calcium: 9.3 mg/dL (ref 8.4–10.5)
Chloride: 106 mEq/L (ref 96–112)
Creat: 0.97 mg/dL (ref 0.50–1.35)
GFR, Est African American: >89 mL/min
GLUCOSE: 92 mg/dL (ref 70–99)
Potassium: 4.3 mEq/L (ref 3.5–5.3)
Sodium: 136 mEq/L (ref 135–145)
Total Protein: 7.6 g/dL (ref 6.0–8.3)

## 2013-08-23 LAB — HEPATITIS C ANTIBODY: HCV AB: REACTIVE — AB

## 2013-08-23 LAB — LIPASE: Lipase: 48 U/L (ref 0–75)

## 2013-08-27 LAB — HCV RNA QUANT RFLX ULTRA OR GENOTYP
HCV QUANT: 384679 [IU]/mL — AB (ref <15)
HCV Quantitative Log: 5.59 {Log} — ABNORMAL HIGH (ref <1.18)

## 2013-08-28 LAB — CULTURE, BLOOD (SINGLE): Organism ID, Bacteria: NO GROWTH

## 2013-08-29 LAB — HEPATITIS C GENOTYPE: HCV Genotype: 3

## 2013-08-30 ENCOUNTER — Telehealth: Payer: Self-pay | Admitting: *Deleted

## 2013-08-30 NOTE — Addendum Note (Signed)
Addended by: Allyson Sabal MD, Ascencion Dike on: 08/30/2013 02:45 PM   Modules accepted: Orders

## 2013-08-30 NOTE — Telephone Encounter (Signed)
Message copied by Ousman Dise, [place] R on Thu Aug 30, 2013  3:58 PM ------      Message from: Allyson Sabal MD, [facility]      Created: Thu Aug 30, 2013  2:46 PM       Notify patient of the patient's labs have come back, hemoglobin is normal platelet count is low because of cirrhosis of the liver. The patient also has hepatitis C confirmed on his blood work. Patient is being referred to gastroenterology. Because of abnormal liver function and hepatitis C infection. The patient should refrain from taking Tylenol, ibuprofen, Aleve, Naprosyn ------

## 2013-08-30 NOTE — Telephone Encounter (Signed)
Left a message with patient's mom for him to give [place] a call back.

## 2013-08-31 ENCOUNTER — Emergency Department (HOSPITAL_COMMUNITY)
Admission: EM | Admit: 2013-08-31 | Discharge: 2013-08-31 | Disposition: A | Discharge status: Home / Self Care | Payer: No Typology Code available for payment source | Attending: Emergency Medicine | Admitting: Emergency Medicine

## 2013-08-31 ENCOUNTER — Telehealth: Payer: Self-pay | Admitting: *Deleted

## 2013-08-31 ENCOUNTER — Encounter (HOSPITAL_COMMUNITY): Payer: Self-pay | Admitting: Emergency Medicine

## 2013-08-31 DIAGNOSIS — Z8719 Personal history of other diseases of the digestive system: Secondary | ICD-10-CM | POA: Insufficient documentation

## 2013-08-31 DIAGNOSIS — Z856 Personal history of leukemia: Secondary | ICD-10-CM | POA: Insufficient documentation

## 2013-08-31 DIAGNOSIS — G8929 Other chronic pain: Secondary | ICD-10-CM

## 2013-08-31 DIAGNOSIS — Z760 Encounter for issue of repeat prescription: Secondary | ICD-10-CM | POA: Insufficient documentation

## 2013-08-31 DIAGNOSIS — F17200 Nicotine dependence, unspecified, uncomplicated: Secondary | ICD-10-CM | POA: Insufficient documentation

## 2013-08-31 DIAGNOSIS — Z79899 Other long term (current) drug therapy: Secondary | ICD-10-CM | POA: Insufficient documentation

## 2013-08-31 DIAGNOSIS — M549 Dorsalgia, unspecified: Secondary | ICD-10-CM | POA: Insufficient documentation

## 2013-08-31 MED ORDER — OXYCODONE HCL ER 10 MG PO T12A
20.0000 mg | EXTENDED_RELEASE_TABLET | Freq: Two times a day (BID) | ORAL | Status: DC
  Administered 2013-08-31: 20 mg via ORAL
  Filled 2013-08-31: qty 2

## 2013-08-31 MED ORDER — OXYCODONE HCL 5 MG PO TABS: ORAL_TABLET | ORAL | Status: DC

## 2013-08-31 NOTE — Discharge Instructions (Signed)
Take pain medication as prescribed. You may alternate ice and heat as desired for comfort and pain relief. Return to ER if you develop fever, new numbness and tingling in legs or groin, unable to walk, change in bowel or bladder habits, or other concerning new symptoms develop.  Back Exercises Back exercises help treat and prevent back injuries. The goal is to increase your strength in your belly (abdominal) and back muscles. These exercises can also help with flexibility. Start these exercises when told by your doctor. HOME CARE Back exercises include: Pelvic Tilt.  Lie on your back with your knees bent. Tilt your pelvis until the lower part of your back is against the floor. Hold this position 5 to 10 sec. Repeat this exercise 5 to 10 times. Knee to Chest.  Pull 1 knee up against your chest and hold for 20 to 30 seconds. Repeat this with the other knee. This may be done with the other leg straight or bent, whichever feels better. Then, pull both knees up against your chest. Sit-Ups or Curl-Ups.  Bend your knees 90 degrees. Start with tilting your pelvis, and do a partial, slow sit-up. Only lift your upper half 30 to 45 degrees off the floor. Take at least 2 to 3 seonds for each sit-up. Do not do sit-ups with your knees out straight. If partial sit-ups are difficult, simply do the above but with only tightening your belly (abdominal) muscles and holding it as told. Hip-Lift.  Lie on your back with your knees flexed 90 degrees. Push down with your feet and shoulders as you raise your hips 2 inches off the floor. Hold for 10 seconds, repeat 5 to 10 times. Back Arches.  Lie on your stomach. Prop yourself up on bent elbows. Slowly press on your hands, causing an arch in your low back. Repeat 3 to 5 times. Shoulder-Lifts.  Lie face down with arms beside your body. Keep hips and belly pressed to floor as you slowly lift your head and shoulders off the floor. Do not overdo your exercises. Be  careful in the beginning. Exercises may cause you some mild back discomfort. If the pain lasts for more than 15 minutes, stop the exercises until you see your doctor. Improvement with exercise for back problems is slow.  Document Released: 07/31/2010 Document Revised: 09/20/2011 Document Reviewed: 04/29/2011 [facility] Patient Information 2014 [place], [place].

## 2013-08-31 NOTE — ED Notes (Signed)
Pt st's he is having back pain and was told to come [facility] to get refill on his pain med.

## 2013-08-31 NOTE — ED Provider Notes (Signed)
CSN: [id number]     Arrival date & time 08/31/13  1551 History  This chart was scribed for non-physician practitioner, Noland Fordyce, PA-C working with Osvaldo Shipper, MD by Frederich Balding, ED scribe. This patient was seen in room TR08C/TR08C and the patient's care was started at 4:44 PM.   Chief Complaint  Patient presents with  . Medication Refill   The history is provided by the patient. No language interpreter was used.   HPI Comments: Rodney Reed is a 48 y.o. male who presents to the Emergency Department for medication refill. Pt has chronic back pain is requesting refills on oxycodone. He states he is still waiting to get an appointment with pain management but has follow up appointments with [facility] next week for blood work. Pt has taken tramadol in the past and says it doesn't work very well. States he takes 20mg  oxycodone. Pt states back pain feels like his regular back pain, worse with movement.  Denies new falls or trauma. Denies bowel or bladder incontinence, fever, nausea, emesis.   Past Medical History  Diagnosis Date  . Back pain   . Cancer   . Cirrhosis   . Pancreatitis    History reviewed. No pertinent past surgical history. History reviewed. No pertinent family history. History  Substance Use Topics  . Smoking status: Current Every Day Smoker -- 1.00 packs/day  . Smokeless tobacco: Not on file  . Alcohol Use: No     Comment: last use 11/21/12    Review of Systems  Constitutional: Negative for fever.  Gastrointestinal: Negative for nausea and vomiting.  Genitourinary:       Negative for bowel or bladder incontinence.  Musculoskeletal: Positive for back pain.  All other systems reviewed and are negative.   Allergies  Vancomycin and Fish allergy  Home Medications   Current Outpatient Rx  Name  Route  Sig  Dispense  Refill  . ALPRAZolam (XANAX) 0.5 MG tablet   Oral   Take 0.5 mg by mouth at bedtime as needed for anxiety or sleep.         Marland Kitchen amphetamine-dextroamphetamine (ADDERALL) 10 MG tablet   Oral   Take 1 tablet (10 mg total) by mouth 2 (two) times daily.   60 tablet   0   . Aspirin-Acetaminophen-Caffeine (GOODY HEADACHE PO)   Oral   Take 1 packet by mouth daily as needed (headache).         . cyclobenzaprine (FLEXERIL) 5 MG tablet   Oral   Take 1 tablet (5 mg total) by mouth 3 (three) times daily as needed for muscle spasms.   45 tablet   1   . oxyCODONE (ROXICODONE) 5 MG immediate release tablet      Take 1-2 tabs every 6-8 hours for pain   10 tablet   0   . Oxycodone HCl 20 MG TABS   Oral   Take 1 tablet (20 mg total) by mouth every 6 (six) hours as needed (pain).   15 tablet   0   . traMADol (ULTRAM) 50 MG tablet   Oral   Take 1 tablet (50 mg total) by mouth every 6 (six) hours as needed for moderate pain.   45 tablet   0    BP 176/90  Pulse 85  Temp(Src) 98 F (36.7 C) (Oral)  Resp 18  SpO2 96%  Physical Exam  Nursing note and vitals reviewed. Constitutional: He is oriented to person, place, and time. He appears well-developed  and well-nourished.  HENT:  Head: Normocephalic and atraumatic.  Eyes: EOM are normal.  Neck: Normal range of motion.  Cardiovascular: Normal rate.   Pulmonary/Chest: Effort normal.  Musculoskeletal: Normal range of motion. He exhibits tenderness ( in back). He exhibits no edema.  Neurological: He is alert and oriented to person, place, and time.  Antalgic gait  Skin: Skin is warm and dry.  Psychiatric: He has a normal mood and affect. His behavior is normal.    ED Course  Procedures (including critical care time)  DIAGNOSTIC STUDIES: Oxygen Saturation is 96% on RA, normal by my interpretation.    COORDINATION OF CARE: 4:47 PM-Discussed treatment plan which includes pain medication in the ED and tramadol prescription with pt at bedside and pt agreed to plan.  Labs Review Labs Reviewed - No data to display Imaging Review No results found.  EKG  Interpretation   None       MDM   Final diagnoses:  Chronic back pain    Pt with chronic back pain requesting refill on pain medication. Denies new fall or injury. denies change in symptoms. No red flag symptoms. States he is in the process of getting into pain management.  Advised pt on misuse of emergent department for chronic pain management. Advised pt he will be given pain medication in ED and a small amount of pain medication. Strongly encouraged him to f/u with his primary care and pain management for ongoing back pain.  5:11 PM Agreed to treat patient's pain in the emergency room, when RN asked to see his driver to ensure he had a ride home, pt became verbally abusive.  Discussed with pt that disrespectful behavior would not be tolerated and he would loose his prescribed medication today if he continued to act disrespectfully.  Pt agreed to be escorted to his car by [employer] if he was given his pain medication in the ED today.     I personally performed the services described in this documentation, which was scribed in my presence. The recorded information has been reviewed and is accurate.   Noland Fordyce, PA-C 08/31/13 2232

## 2013-08-31 NOTE — Telephone Encounter (Signed)
Patient returned a telephone call. Notified patient of the patient's labs have come back, hemoglobin is normal platelet count is low because of cirrhosis of the liver. The patient also has hepatitis C confirmed on his blood work. Patient is being referred to gastroenterology. Because of abnormal liver function and hepatitis C infection. The patient should refrain from taking Tylenol, ibuprofen, Aleve, Naprosyn.

## 2013-08-31 NOTE — ED Notes (Signed)
Pt in requesting refills on pain medication for back, states he still doesn't have his appointment set with the pain clinic but has some follow ups with the wellness center next week for blood work and isn't sure if they will refill his medications.

## 2013-08-31 NOTE — ED Notes (Signed)
Pt became upset and started yelling when told that he needed to have a driver before he could be given narcotics.  Meds given and [employer] will walk out with pt to assure pt has a ride.

## 2013-09-01 NOTE — ED Provider Notes (Signed)
Medical screening examination/treatment/procedure(s) were performed by non-physician practitioner and as supervising physician I was immediately available for consultation/collaboration.  EKG Interpretation   None         Osvaldo Shipper, MD 09/01/13 [phone]

## 2013-09-03 ENCOUNTER — Emergency Department (HOSPITAL_COMMUNITY)
Admission: EM | Admit: 2013-09-03 | Discharge: 2013-09-03 | Disposition: A | Discharge status: Home / Self Care | Payer: No Typology Code available for payment source | Attending: Emergency Medicine | Admitting: Emergency Medicine

## 2013-09-03 ENCOUNTER — Telehealth: Payer: Self-pay | Admitting: *Deleted

## 2013-09-03 ENCOUNTER — Encounter (HOSPITAL_COMMUNITY): Payer: Self-pay | Admitting: Emergency Medicine

## 2013-09-03 DIAGNOSIS — Z8719 Personal history of other diseases of the digestive system: Secondary | ICD-10-CM | POA: Insufficient documentation

## 2013-09-03 DIAGNOSIS — Y93E1 Activity, personal bathing and showering: Secondary | ICD-10-CM | POA: Insufficient documentation

## 2013-09-03 DIAGNOSIS — Z79899 Other long term (current) drug therapy: Secondary | ICD-10-CM | POA: Insufficient documentation

## 2013-09-03 DIAGNOSIS — W010XXA Fall on same level from slipping, tripping and stumbling without subsequent striking against object, initial encounter: Secondary | ICD-10-CM | POA: Insufficient documentation

## 2013-09-03 DIAGNOSIS — F17200 Nicotine dependence, unspecified, uncomplicated: Secondary | ICD-10-CM | POA: Insufficient documentation

## 2013-09-03 DIAGNOSIS — G8929 Other chronic pain: Secondary | ICD-10-CM | POA: Insufficient documentation

## 2013-09-03 DIAGNOSIS — IMO0002 Reserved for concepts with insufficient information to code with codable children: Secondary | ICD-10-CM | POA: Insufficient documentation

## 2013-09-03 DIAGNOSIS — Y929 Unspecified place or not applicable: Secondary | ICD-10-CM | POA: Insufficient documentation

## 2013-09-03 DIAGNOSIS — Z859 Personal history of malignant neoplasm, unspecified: Secondary | ICD-10-CM | POA: Insufficient documentation

## 2013-09-03 MED ORDER — OXYCODONE HCL 5 MG PO TABS
5.0000 mg | ORAL_TABLET | Freq: Once | ORAL | Status: AC
  Administered 2013-09-03: 5 mg via ORAL
  Filled 2013-09-03: qty 1

## 2013-09-03 NOTE — Discharge Instructions (Signed)
Chronic Pain Discharge Instructions  °Emergency care providers appreciate that many patients coming to us are in severe pain and we wish to address their pain in the safest, most responsible manner.  It is important to recognize however, that the proper treatment of chronic pain differs from that of the pain of injuries and acute illnesses.  Our goal is to provide quality, safe, personalized care and we thank you for giving us the opportunity to serve you. °The use of narcotics and related agents for chronic pain syndromes may lead to additional physical and psychological problems.  Nearly as many people die from prescription narcotics each year as die from car crashes.  Additionally, this risk is increased if such prescriptions are obtained from a variety of sources.  Therefore, only your primary care physician or a pain management specialist is able to safely treat such syndromes with narcotic medications long-term.   ° °Documentation revealing such prescriptions have been sought from multiple sources may prohibit us from providing a refill or different narcotic medication.  Your name may be checked first through the [place] Controlled Substances Reporting System.  This database is a record of controlled substance medication prescriptions that the patient has received.  This has been established by [place] in an effort to eliminate the dangerous, and often life threatening, practice of obtaining multiple prescriptions from different medical providers.  ° °If you have a chronic pain syndrome (i.e. chronic headaches, recurrent back or neck pain, dental pain, abdominal or pelvis pain without a specific diagnosis, or neuropathic pain such as fibromyalgia) or recurrent visits for the same condition without an acute diagnosis, you may be treated with non-narcotics and other non-addictive medicines.  Allergic reactions or negative side effects that may be reported by a patient to such medications will not  typically lead to the use of a narcotic analgesic or other controlled substance as an alternative. °  °Patients managing chronic pain with a personal physician should have provisions in place for breakthrough pain.  If you are in crisis, you should call your physician.  If your physician directs you to the emergency department, please have the doctor call and speak to our attending physician concerning your care. °  °When patients come to the Emergency Department (ED) with acute medical conditions in which the Emergency Department physician feels appropriate to prescribe narcotic or sedating pain medication, the physician will prescribe these in very limited quantities.  The amount of these medications will last only until you can see your primary care physician in his/her office.  Any patient who returns to the ED seeking refills should expect only non-narcotic pain medications.  ° °In the event of an acute medical condition exists and the emergency physician feels it is necessary that the patient be given a narcotic or sedating medication -  a responsible adult driver should be present in the room prior to the medication being given by the nurse. °  °Prescriptions for narcotic or sedating medications that have been lost, stolen or expired will not be refilled in the Emergency Department.   ° °Patients who have chronic pain may receive non-narcotic prescriptions until seen by their primary care physician.  It is every patient’s personal responsibility to maintain active prescriptions with his or her primary care physician or specialist. °

## 2013-09-03 NOTE — ED Notes (Signed)
Pt here for pain meds. Was here 3 days ago for same.

## 2013-09-03 NOTE — ED Notes (Addendum)
Pt reports lower and middle back pain for years. Yesterday slipped in the shower and caught himself on his knees since then pain has been worse. Pt reports he needs two back surgeries. Was given oxycodone on Friday and he is out. Pt is a x 4. Is ambulatory, can bear wt.

## 2013-09-03 NOTE — ED Provider Notes (Signed)
Medical screening examination/treatment/procedure(s) were performed by non-physician practitioner and as supervising physician I was immediately available for consultation/collaboration.  EKG Interpretation   None         Maudry Diego, MD 09/03/13 [phone]

## 2013-09-03 NOTE — ED Provider Notes (Signed)
CSN: [id number]     Arrival date & time 09/03/13  1216 History  This chart was scribed for non-physician practitioner, Montine Circle, PA-C working with Maudry Diego, MD by Frederich Balding, ED scribe. This patient was seen in room TR06C/TR06C and the patient's care was started at 12:43 PM.    Chief Complaint  Patient presents with  . Back Pain   The history is provided by the patient. No language interpreter was used.   HPI Comments: Rodney Reed is a 48 y.o. male who presents to the Emergency Department complaining of chronic throbbing back pain. He was evaluated 2 days ago for the same and given 10 tablets of oxycodone. Pt is waiting for the pain clinic to call him to schedule an appointment. He repeatedly says he is not a drug addict and is requesting pain relief until he can go to the pain clinic. Pt states he will take what he can get in the ED today and come back tomorrow.   Past Medical History  Diagnosis Date  . Back pain   . Cancer   . Cirrhosis   . Pancreatitis    History reviewed. No pertinent past surgical history. No family history on file. History  Substance Use Topics  . Smoking status: Current Every Day Smoker -- 1.00 packs/day  . Smokeless tobacco: Not on file  . Alcohol Use: No     Comment: last use 11/21/12    Review of Systems  Constitutional: Negative for fever.  HENT: Negative for congestion.   Eyes: Negative for redness.  Respiratory: Negative for shortness of breath.   Musculoskeletal: Positive for back pain.  Skin: Negative for rash.  Neurological: Negative for speech difficulty.  Psychiatric/Behavioral: Negative for confusion.   Allergies  Vancomycin and Fish allergy  Home Medications   Current Outpatient Rx  Name  Route  Sig  Dispense  Refill  . ALPRAZolam (XANAX) 0.5 MG tablet   Oral   Take 0.5 mg by mouth at bedtime as needed for anxiety or sleep.         Marland Kitchen amphetamine-dextroamphetamine (ADDERALL) 10 MG tablet   Oral   Take 1 tablet  (10 mg total) by mouth 2 (two) times daily.   60 tablet   0   . Aspirin-Acetaminophen-Caffeine (GOODY HEADACHE PO)   Oral   Take 1 packet by mouth daily as needed (headache).         . oxyCODONE (ROXICODONE) 5 MG immediate release tablet      Take 1-2 tabs every 6-8 hours for pain   10 tablet   0   . traMADol (ULTRAM) 50 MG tablet   Oral   Take 1 tablet (50 mg total) by mouth every 6 (six) hours as needed for moderate pain.   45 tablet   0    BP 150/77  Pulse 102  Temp(Src) 97.8 F (36.6 C) (Oral)  Resp 22  Ht 6' (1.829 m)  Wt 205 lb (92.987 kg)  BMI 27.80 kg/m2  SpO2 100%  Physical Exam  Nursing note and vitals reviewed. Constitutional: He is oriented to person, place, and time. He appears well-developed. No distress.  HENT:  Head: Normocephalic and atraumatic.  Eyes: Conjunctivae and EOM are normal.  Cardiovascular: Normal rate and regular rhythm.   Pulmonary/Chest: Effort normal. No stridor. No respiratory distress.  Abdominal: He exhibits no distension.  Musculoskeletal: He exhibits no edema.  Neurological: He is alert and oriented to person, place, and time.  Skin: Skin is warm  and dry.  Psychiatric: He has a normal mood and affect.    ED Course  Procedures (including critical care time)  DIAGNOSTIC STUDIES: Oxygen Saturation is 100% on RA, normal by my interpretation.    COORDINATION OF CARE: 12:46 PM-Discussed treatment plan which includes one dose of 5 mg oxycodone in the ED with pt at bedside and pt agreed to plan.   Labs Review Labs Reviewed - No data to display Imaging Review No results found.  EKG Interpretation   None       MDM   Final diagnoses:  Chronic pain    Chronic pain.  Will treat here.  Recommend pain management follow-up.  Do not feel additional narcotics will be beneficial to the patient's overall health, therefore, no prescription for home.  I personally performed the services described in this documentation, which  was scribed in my presence. The recorded information has been reviewed and is accurate.    Montine Circle, PA-C 09/03/13 1630

## 2013-09-03 NOTE — Telephone Encounter (Signed)
Patient contacted [place] about a referral. Patient hasn't received a telephone call about referral. Left [place] a voicemail to return his call.

## 2013-09-03 NOTE — Telephone Encounter (Signed)
Returned patient's telephone call about a referral. Left a voicemail for patient to give [place] a call back.

## 2013-09-03 NOTE — Telephone Encounter (Addendum)
Returned patient's telephone call about about patient's referral to a pain clinic. Patient requests to changed his lab appointment to a later date. Rescheduled lab appointment effectively. Patient began to ask questions about refilling Tramadol and about his referral to a pain clinic. Tried to explain to patient that her recently had a refill for Tramadol on 08/22/13 and he shouldn't have ran out of medication so quickly. Also tried to explain to the patient that his referral is being processed and that our referral specialist Alinda Sierras will be in contact with him soon. That only hold up is from the patient not having any insurance to cover his visits. Patient began to scream and become very disrespectful over the phone. Politely, I placed the patient on hold to transfer him to my supervisor Sharee Pimple. Sharee Pimple kindly explained the same statementnt that I made to the patient. The patient's mother requested to speak with Sharee Pimple about the matter at hand. Sharee Pimple handled the situation professionally and referred the patient to Alinda Sierras for further details about his referral to the pain clinic. Call was completed as followed.

## 2013-09-05 ENCOUNTER — Telehealth: Payer: Self-pay | Admitting: *Deleted

## 2013-09-05 ENCOUNTER — Other Ambulatory Visit: Payer: Self-pay

## 2013-09-05 NOTE — Telephone Encounter (Signed)
Patient's mother called in acquiring about patient's pain clinic referral. Transferred patient to our referral specialist Alinda Sierras.

## 2013-09-06 ENCOUNTER — Other Ambulatory Visit: Payer: Self-pay

## 2013-09-07 ENCOUNTER — Telehealth: Payer: Self-pay | Admitting: Emergency Medicine

## 2013-09-07 NOTE — Telephone Encounter (Signed)
Left message for pt to call clinic for results 

## 2013-09-07 NOTE — Telephone Encounter (Signed)
Message copied by Ricci Barker on Fri Sep 07, 2013  4:18 PM ------      Message from: Allyson Sabal MD, [facility]      Created: Thu Aug 30, 2013  2:46 PM       Notify patient of the patient's labs have come back, hemoglobin is normal platelet count is low because of cirrhosis of the liver. The patient also has hepatitis C confirmed on his blood work. Patient is being referred to gastroenterology. Because of abnormal liver function and hepatitis C infection. The patient should refrain from taking Tylenol, ibuprofen, Aleve, Naprosyn ------

## 2013-09-10 NOTE — Telephone Encounter (Signed)
Patient is calling back. He stated that phone has been acting up. Attempted to call back, but no answer. He was calling pertaning to below

## 2013-09-11 ENCOUNTER — Ambulatory Visit: Payer: No Typology Code available for payment source | Attending: Internal Medicine

## 2013-09-11 DIAGNOSIS — B1920 Unspecified viral hepatitis C without hepatic coma: Secondary | ICD-10-CM

## 2013-09-11 DIAGNOSIS — G8929 Other chronic pain: Secondary | ICD-10-CM

## 2013-09-11 DIAGNOSIS — M549 Dorsalgia, unspecified: Principal | ICD-10-CM

## 2013-09-11 MED ORDER — TRAMADOL HCL 50 MG PO TABS: 50.0000 mg | ORAL_TABLET | Freq: Four times a day (QID) | ORAL | Status: DC | PRN

## 2013-09-11 MED ORDER — TRAMADOL HCL 50 MG PO TABS: 100.0000 mg | ORAL_TABLET | Freq: Two times a day (BID) | ORAL | Status: DC | PRN

## 2013-09-11 NOTE — Addendum Note (Signed)
Addended by: Allyson Sabal MD, Ascencion Dike on: 09/11/2013 04:46 PM   Modules accepted: Orders

## 2013-09-11 NOTE — Addendum Note (Signed)
Addended by: Allyson Sabal MD, [facility]enc[facility] Dike on: 09/11/2013 04:34 PM   Modules accepted: Orders

## 2013-09-11 NOTE — Addendum Note (Signed)
Addended by: Allyson Sabal MD, Ascenc[facility] Dike on: 09/11/2013 04:38 PM   Modules accepted: Orders

## 2013-09-13 LAB — HEPATITIS C RNA QUANTITATIVE
HCV QUANT LOG: 4.9 {Log} — AB (ref <1.18)
HCV QUANT: 80124 [IU]/mL — AB (ref <15)

## 2013-09-14 NOTE — Addendum Note (Signed)
Addended by: Allyson Sabal MD, Ascenc[facility] Dike on: 09/14/2013 02:10 PM   Modules accepted: Orders

## 2013-09-17 ENCOUNTER — Telehealth: Payer: Self-pay | Admitting: *Deleted

## 2013-09-17 NOTE — Telephone Encounter (Signed)
Message copied by Anayia Eugene, [place] R on Mon Sep 17, 2013  9:18 AM ------      Message from: Allyson Sabal MD, [facility]      Created: Fri Sep 14, 2013  2:10 PM       Patient has hepatitis C, being referred to gastroenterology ------

## 2013-09-17 NOTE — Telephone Encounter (Signed)
Left a voicemail to return our call.

## 2013-10-15 ENCOUNTER — Telehealth: Payer: Self-pay

## 2013-10-15 NOTE — Telephone Encounter (Signed)
Patient's mother called in stating her son needs a refill On his tramadol

## 2013-10-22 ENCOUNTER — Ambulatory Visit: Payer: No Typology Code available for payment source | Attending: Internal Medicine | Admitting: Internal Medicine

## 2013-10-22 ENCOUNTER — Encounter: Payer: Self-pay | Admitting: Internal Medicine

## 2013-10-22 VITALS — BP 148/71 | HR 88 | Temp 98.4°F | Resp 18 | Ht 72.0 in | Wt 217.0 lb

## 2013-10-22 DIAGNOSIS — M549 Dorsalgia, unspecified: Secondary | ICD-10-CM

## 2013-10-22 DIAGNOSIS — G8929 Other chronic pain: Secondary | ICD-10-CM

## 2013-10-22 MED ORDER — TRAMADOL HCL 50 MG PO TABS: 100.0000 mg | ORAL_TABLET | Freq: Two times a day (BID) | ORAL | Status: DC | PRN

## 2013-10-22 NOTE — Progress Notes (Signed)
Pt here to f/u with chronic back pain radiating all over controlled with Tramadol Pt ran out of pain medication 3 dys ago States pain clinic denied him Needs refill Tramadol

## 2013-10-22 NOTE — Progress Notes (Deleted)
Patient ID: Rodney Reed, male   DOB: 01-20-1966, 48 y.o.   MRN: [id number]   CC: follow up for back pain, Hep C   HPI:  Allergies  Allergen Reactions  . Vancomycin Anaphylaxis  . Fish Allergy Other (See Comments)    Hot flash, dizziness   Past Medical History  Diagnosis Date  . Back pain   . Cancer   . Cirrhosis   . Pancreatitis    Current Outpatient Prescriptions on File Prior to Visit  Medication Sig Dispense Refill  . amphetamine-dextroamphetamine (ADDERALL) 10 MG tablet Take 1 tablet (10 mg total) by mouth 2 (two) times daily.  60 tablet  0  . ALPRAZolam (XANAX) 0.5 MG tablet Take 0.5 mg by mouth at bedtime as needed for anxiety or sleep.      . Aspirin-Acetaminophen-Caffeine (GOODY HEADACHE PO) Take 1 packet by mouth daily as needed (headache).      . oxyCODONE (ROXICODONE) 5 MG immediate release tablet Take 1-2 tabs every 6-8 hours for pain  10 tablet  0   No current facility-administered medications on file prior to visit.   History reviewed. No pertinent family history. History   Social History  . Marital Status: Legally Separated    Spouse Name: N/A    Number of Children: N/A  . Years of Education: N/A   Occupational History  . Not on file.   Social History Main Topics  . Smoking status: Current Every Day Smoker -- 1.00 packs/day  . Smokeless tobacco: Not on file  . Alcohol Use: No     Comment: last use 11/21/12  . Drug Use: No     Comment:  hx of heroin use; sts former use  . Sexual Activity: Not on file   Other Topics Concern  . Not on file   Social History Narrative  . No narrative on file    Review of Systems: Constitutional: Negative for fever, chills, diaphoresis, activity change, appetite change and fatigue. HENT: Negative for ear pain, nosebleeds, congestion, facial swelling, rhinorrhea, neck pain, neck stiffness and ear discharge.  Eyes: Negative for pain, discharge, redness, itching and visual disturbance. Respiratory: Negative for cough,  choking, chest tightness, shortness of breath, wheezing and stridor.  Cardiovascular: Negative for chest pain, palpitations and leg swelling. Gastrointestinal: Negative for abdominal distention. Genitourinary: Negative for dysuria, urgency, frequency, hematuria, flank pain, decreased urine volume, difficulty urinating and dyspareunia.  Musculoskeletal: Negative for back pain, joint swelling, arthralgias and gait problem. Neurological: Negative for dizziness, tremors, seizures, syncope, facial asymmetry, speech difficulty, weakness, light-headedness, numbness and headaches.  Hematological: Negative for adenopathy. Does not bruise/bleed easily. Psychiatric/Behavioral: Negative for hallucinations, behavioral problems, confusion, dysphoric mood, decreased concentration and agitation.    Objective:   Filed Vitals:   10/22/13 1657  BP: 148/71  Pulse: 88  Temp: 98.4 F (36.9 C)  Resp: 18    Physical Exam: Constitutional: Patient appears well-developed and well-nourished. No distress. HENT: Normocephalic, atraumatic, External right and left ear normal. Oropharynx is clear and moist.  Eyes: Conjunctivae and EOM are normal. PERRLA, no scleral icterus. Neck: Normal ROM. Neck supple. No JVD. No tracheal deviation. No thyromegaly. CVS: RRR, S1/S2 +, no murmurs, no gallops, no carotid bruit.  Pulmonary: Effort and breath sounds normal, no stridor, rhonchi, wheezes, rales.  Abdominal: Soft. BS +,  no distension, tenderness, rebound or guarding.  Musculoskeletal: Normal range of motion. No edema and no tenderness.  Lymphadenopathy: No lymphadenopathy noted, cervical, inguinal or axillary Neuro: Alert. Normal reflexes, muscle tone coordination.  No cranial nerve deficit. Skin: Skin is warm and dry. No rash noted. Not diaphoretic. No erythema. No pallor. Psychiatric: Normal mood and affect. Behavior, judgment, thought content normal.  Lab Results  Component Value Date   WBC 6.6 08/22/2013   HGB  14.1 08/22/2013   HCT 41.7 08/22/2013   MCV 86.2 08/22/2013   PLT 89* 08/22/2013   Lab Results  Component Value Date   CREATININE 0.97 08/22/2013   BUN 17 08/22/2013   NA 136 08/22/2013   K 4.3 08/22/2013   CL 106 08/22/2013   CO2 20 08/22/2013    No results found for this basename: HGBA1C   Lipid Panel     Component Value Date/Time   CHOL 201* 08/22/2013 1510   TRIG 101 08/22/2013 1510   HDL 55 08/22/2013 1510   CHOLHDL 3.7 08/22/2013 1510   VLDL 20 08/22/2013 1510   LDLCALC 126* 08/22/2013 1510       Assessment and plan:   There are no active problems to display for this patient.

## 2013-10-22 NOTE — Patient Instructions (Signed)

## 2013-10-22 NOTE — Progress Notes (Signed)
Patient ID: Rodney Reed, male   DOB: 1965/08/26, 48 y.o.   MRN: [id number]   CC: Follow up for chronic back pain  HPI: Rodney Reed presents to the clinic today for follow up of his chronic back pain.  He has been given numerous referrals to pain clinic which have all been unsuccessful.  The last two clinics he states that he could not go to because the co-pay was too expensive and the other clinic in [place] denied him for unknown reason.  Patient reports that he was previously seen by a pain management clinic in [facility] but was discharged due to testing positive for Suboxone. Patient has been taking Tramadol 100 mg daily with moderate relief.  He reports the pain as a constant throbbing pain that is exacerbated by prolong sitting, standing, and walking.  This pain has been persistent for the past 2 years.  Patient denies any changes in mobility or elimination.    Allergies  Allergen Reactions  . Vancomycin Anaphylaxis  . Fish Allergy Other (See Comments)    Hot flash, dizziness   Past Medical History  Diagnosis Date  . Back pain   . Cancer   . Cirrhosis   . Pancreatitis    Current Outpatient Prescriptions on File Prior to Visit  Medication Sig Dispense Refill  . amphetamine-dextroamphetamine (ADDERALL) 10 MG tablet Take 1 tablet (10 mg total) by mouth 2 (two) times daily.  60 tablet  0  . ALPRAZolam (XANAX) 0.5 MG tablet Take 0.5 mg by mouth at bedtime as needed for anxiety or sleep.      . Aspirin-Acetaminophen-Caffeine (GOODY HEADACHE PO) Take 1 packet by mouth daily as needed (headache).      . oxyCODONE (ROXICODONE) 5 MG immediate release tablet Take 1-2 tabs every 6-8 hours for pain  10 tablet  0   No current facility-administered medications on file prior to visit.   History reviewed. No pertinent family history. History   Social History  . Marital Status: Legally Separated    Spouse Name: N/A    Number of Children: N/A  . Years of Education: N/A   Occupational  History  . Not on file.   Social History Main Topics  . Smoking status: Current Every Day Smoker -- 1.00 packs/day  . Smokeless tobacco: Not on file  . Alcohol Use: No     Comment: last use 11/21/12  . Drug Use: No     Comment:  hx of heroin use; sts former use  . Sexual Activity: Not on file   Other Topics Concern  . Not on file   Social History Narrative  . No narrative on file    Review of Systems: Constitutional: Negative for fever, chills, diaphoresis, activity change, appetite change and fatigue.  Respiratory: Negative for cough, choking, chest tightness, shortness of breath, wheezing and stridor.  Cardiovascular: Negative for chest pain, palpitations and leg swelling. Gastrointestinal: Negative for abdominal distention. Genitourinary: Negative for dysuria, urgency, frequency, hematuria, flank pain, decreased urine volume, difficulty urinating and dyspareunia.  Musculoskeletal: Negative for joint swelling, arthralgias and gait problem. Positive for chronic back pain Neurological: Negative for dizziness, tremors, seizures, syncope, facial asymmetry, speech difficulty, weakness, light-headedness, numbness and headaches.      Objective:   Filed Vitals:   10/22/13 1657  BP: 148/71  Pulse: 88  Temp: 98.4 F (36.9 C)  Resp: 18    Physical Exam: Constitutional: Patient appears well-developed and well-nourished. No distress.  Eyes: Conjunctivae and EOM are normal. PERRLA,  no scleral icterus. Neck: Neck supple. No JVD.   CVS: RRR, S1/S2 +, no murmurs, no gallops, no carotid bruit.  Pulmonary: Effort and breath sounds normal, no stridor, rhonchi, wheezes, rales.  Abdominal: Soft. BS +,  no distension, tenderness, rebound or guarding.  Musculoskeletal: Normal range of motion. No edema and no tenderness.  Lymphadenopathy: No cervical lymphadenopathy noted Neuro: Alert. Normal reflexes, muscle tone coordination.  Skin: Skin is warm and dry. No rash noted. Not diaphoretic.  No erythema. No pallor. Psychiatric: Very anxious.  Lab Results  Component Value Date   WBC 6.6 08/22/2013   HGB 14.1 08/22/2013   HCT 41.7 08/22/2013   MCV 86.2 08/22/2013   PLT 89* 08/22/2013   Lab Results  Component Value Date   CREATININE 0.97 08/22/2013   BUN 17 08/22/2013   NA 136 08/22/2013   K 4.3 08/22/2013   CL 106 08/22/2013   CO2 20 08/22/2013    No results found for this basename: HGBA1C   Lipid Panel     Component Value Date/Time   CHOL 201* 08/22/2013 1510   TRIG 101 08/22/2013 1510   HDL 55 08/22/2013 1510   CHOLHDL 3.7 08/22/2013 1510   VLDL 20 08/22/2013 1510   LDLCALC 126* 08/22/2013 1510       Assessment and plan:   Rodney Reed was seen today for follow-up and back pain.  Diagnoses and associated orders for this visit:  Chronic back pain - traMADol (ULTRAM) 50 MG tablet; Take 2 tablets (100 mg total) by mouth every 12 (twelve) hours as needed for moderate pain.    Follow up PRN or if symptoms worsen.       Chari Manning, NP-C [facility] [phone] 10/22/2013, 8:16 PM

## 2013-10-29 ENCOUNTER — Encounter: Payer: Self-pay | Admitting: Internal Medicine

## 2013-10-29 ENCOUNTER — Ambulatory Visit (INDEPENDENT_AMBULATORY_CARE_PROVIDER_SITE_OTHER): Payer: No Typology Code available for payment source | Admitting: Internal Medicine

## 2013-10-29 VITALS — BP 130/78 | HR 83 | Temp 97.4°F | Wt 214.0 lb

## 2013-10-29 DIAGNOSIS — B1920 Unspecified viral hepatitis C without hepatic coma: Secondary | ICD-10-CM | POA: Insufficient documentation

## 2013-10-29 NOTE — Progress Notes (Signed)
+Rodney Reed is a 48 y.o. male who presents for initial evaluation and management of a positive Hepatitis C antibody test.  Patient tested positive oringinallyin  1993. Test was performed as part of an evaluation of unknown. Hepatitis C risk factors present are: tattoos. Patient denies accidental needle stick, history of blood transfusion, intranasal drug use, IV drug abuse, multiple sexual partners, sexual contact with person with liver disease. Patient has had other studies performed. Results: hepatitis C RNA by PCR, result: positive, Genotype 3. Patient has not had prior treatment for Hepatitis C. Patient does have a past history of liver disease. Patient does not have a family history of liver disease.   HPI: Comes in for evaluation of hepatitis C.  Is applying for disability and medicaid.  Has chronic back pain.  Was offered treatment when he was originally diagnosed in 1993 with interferon but did not get.  Also with active abdominal pain, was told it is likely due to pancreatitis.    Patient does not have documented immunity to Hepatitis A. Patient does not have documented immunity to Hepatitis B.     Review of Systems A comprehensive review of systems was negative.   Past Medical History  Diagnosis Date  . Back pain   . Cancer   . Cirrhosis   . Pancreatitis     History  Substance Use Topics  . Smoking status: Current Every Day Smoker -- 1.00 packs/day  . Smokeless tobacco: Not on file  . Alcohol Use: No     Comment: last use 11/21/12  denies IV drug use, no alcohol  No family history on file. No liver disease   Objective:   Filed Vitals:   10/29/13 0948  BP: 130/78  Pulse: 83  Temp: 97.4 F (36.3 C)   well developed and well nourished, mild distress with abdominal pain HEENT: anicteric Cor RRR and No murmurs clear Bowel sounds are normal, liver is not enlarged, spleen is not enlarged, + abdominal distentiion peripheral pulses normal, no pedal edema, no clubbing or  cyanosis negative for - jaundice, spider hemangioma, telangiectasia, palmar erythema, ecchymosis and atrophy  Laboratory Genotype:  Lab Results  Component Value Date   HCVGENOTYPE 3 08/22/2013   HCV viral load: 384679/80124 Lab Results  Component Value Date   WBC 6.6 08/22/2013   HGB 14.1 08/22/2013   HCT 41.7 08/22/2013   MCV 86.2 08/22/2013   PLT 89* 08/22/2013    Lab Results  Component Value Date   CREATININE 0.97 08/22/2013   BUN 17 08/22/2013   NA 136 08/22/2013   K 4.3 08/22/2013   CL 106 08/22/2013   CO2 20 08/22/2013    Lab Results  Component Value Date   ALT 233* 08/22/2013   AST 168* 08/22/2013   ALKPHOS 142* 08/22/2013   BILITOT 0.7 08/22/2013     Assessment: Hepatitis C genotype 3  Plan: 1) Patient counseled extensively on avoiding acetaminophen and alcohol. 2) Transmission discussed with patient including sexual transmission, sharing razors and toothbrush.   3) Will need referral to gastroenterology: Y - he will need referral to hepatology asap (no coverage at this time and unable to get to an academic center) 4) Will need referral for substance abuse counseling: N 5) Patient with cirrhosis based on labs (low platelets), CT scan.  Also with splenomegaly noted.  He needs evaluation by hepatology and consideration of treatment.  Child-Pugh score and MELD unable to do without INR but APRI 3.3 (consistent with cirrhosis).  I consider this  uncompensated cirrhosis based on splenomegaly.

## 2013-10-29 NOTE — Patient Instructions (Signed)
As soon as you have medicaid, we will refer you to hepatology

## 2013-11-15 ENCOUNTER — Telehealth: Payer: Self-pay | Admitting: Internal Medicine

## 2013-11-15 NOTE — Telephone Encounter (Signed)
Pt's mom, Vaughan Basta,  calling says pt needs med refill for Tramadol and needs to be seen by physician. Mom says pt is in a lot of pain and needs to be seen even though he had appt on 10/22/13.  Would like to speak to nurse to know if pt needs to come in for appt or if medication can be refilled.

## 2013-11-29 ENCOUNTER — Emergency Department (HOSPITAL_COMMUNITY)
Admission: EM | Admit: 2013-11-29 | Discharge: 2013-11-29 | Disposition: A | Discharge status: Home / Self Care | Payer: Medicaid Other | Attending: Emergency Medicine | Admitting: Emergency Medicine

## 2013-11-29 ENCOUNTER — Encounter (HOSPITAL_COMMUNITY): Payer: Self-pay | Admitting: Emergency Medicine

## 2013-11-29 DIAGNOSIS — K029 Dental caries, unspecified: Secondary | ICD-10-CM | POA: Diagnosis not present

## 2013-11-29 DIAGNOSIS — F17200 Nicotine dependence, unspecified, uncomplicated: Secondary | ICD-10-CM | POA: Diagnosis not present

## 2013-11-29 DIAGNOSIS — K089 Disorder of teeth and supporting structures, unspecified: Secondary | ICD-10-CM | POA: Diagnosis not present

## 2013-11-29 DIAGNOSIS — K0889 Other specified disorders of teeth and supporting structures: Secondary | ICD-10-CM

## 2013-11-29 DIAGNOSIS — Z859 Personal history of malignant neoplasm, unspecified: Secondary | ICD-10-CM | POA: Insufficient documentation

## 2013-11-29 DIAGNOSIS — K006 Disturbances in tooth eruption: Secondary | ICD-10-CM | POA: Diagnosis not present

## 2013-11-29 MED ORDER — OXYCODONE HCL 5 MG PO TABS
5.0000 mg | ORAL_TABLET | Freq: Once | ORAL | Status: AC
  Administered 2013-11-29: 5 mg via ORAL
  Filled 2013-11-29: qty 1

## 2013-11-29 MED ORDER — TRAMADOL HCL 50 MG PO TABS: 50.0000 mg | ORAL_TABLET | Freq: Four times a day (QID) | ORAL | Status: DC | PRN

## 2013-11-29 MED ORDER — PENICILLIN V POTASSIUM 500 MG PO TABS: 500.0000 mg | ORAL_TABLET | Freq: Four times a day (QID) | ORAL | Status: DC

## 2013-11-29 NOTE — ED Notes (Addendum)
Presents with upper and lower dental pain-poor dental hygiene and dental caries noted. Pt also states he feels a sharp pain in ribs only when he takes a deep breath or coughs.

## 2013-11-29 NOTE — ED Provider Notes (Signed)
CSN: [id number]     Arrival date & time 11/29/13  1818 History  This chart was scribed for Quincy Carnes, PA, working with Ezequiel Essex, MD, by Delphia Grates, ED Scribe. This patient was seen in room TR11C/TR11C and the patient's care was started at 7:02 PM.     Chief Complaint  Patient presents with  . Dental Pain     The history is provided by the patient. No language interpreter was used.   HPI Comments: Rodney Reed is a 48 y.o. male who presents to the Emergency Department complaining of constant upper and lower dental pain onset 2 weeks ago. Patient describes the pain as throbbing, and states it is worse at night. He states he takes [facility] Powder with only temporary relief. Patient denies fever or trouble swallowing. Patient has history of cirrhosis, and would prefer to avoid tylenol. Patient states he has an [employer], however, it will be one month before he can be seen by a dentist.    Patient also complains of sharp pains when he takes deep breaths or coughs which has been ongoing for the past month to month and half. Patient states this problem is not that critical and that he has an appointment at the [facility] in the next week to address the matter.  He denies chest pain, palpitations, shortness of breath, radiation of pain into arm or extremities.  Pt is a daily smoker.  No prior cardiac hx.  Past Medical History  Diagnosis Date  . Back pain   . Cancer   . Cirrhosis   . Pancreatitis    History reviewed. No pertinent past surgical history. History reviewed. No pertinent family history. History  Substance Use Topics  . Smoking status: Current Every Day Smoker -- 1.00 packs/day  . Smokeless tobacco: Not on file  . Alcohol Use: No     Comment: last use 11/21/12    Review of Systems  Constitutional: Negative for fever.  HENT: Positive for dental problem. Negative for trouble swallowing.   All other systems reviewed and are negative.     Allergies   Vancomycin and Fish allergy  Home Medications   Prior to Admission medications   Medication Sig Start Date End Date Taking? Authorizing Provider  ALPRAZolam Duanne Moron) 0.5 MG tablet Take 0.5 mg by mouth at bedtime as needed for anxiety or sleep.    Historical Provider, MD  Aspirin-Acetaminophen-Caffeine (GOODY HEADACHE PO) Take 1 packet by mouth daily as needed (headache).    Historical Provider, MD  oxyCODONE (ROXICODONE) 5 MG immediate release tablet Take 1-2 tabs every 6-8 hours for pain 08/31/13   Noland Fordyce, PA-C  traMADol (ULTRAM) 50 MG tablet Take 2 tablets (100 mg total) by mouth every 12 (twelve) hours as needed for moderate pain. 10/22/13   Lance Bosch, NP   Triage Vitals: BP 143/69  Pulse 79  Temp(Src) 98.3 F (36.8 C)  Resp 20  SpO2 100%  Physical Exam  Nursing note and vitals reviewed. Constitutional: He is oriented to person, place, and time. He appears well-developed and well-nourished.  HENT:  Head: Normocephalic and atraumatic.  Mouth/Throat: Uvula is midline, oropharynx is clear and moist and mucous membranes are normal. Abnormal dentition. Dental caries present. No oropharyngeal exudate, posterior oropharyngeal edema, posterior oropharyngeal erythema or tonsillar abscesses.  Teeth largely in poor dentition with multiple cavities present, molars broken with large cavities present, surrounding gingiva mildly erythematous but no obvious dental abscess, handling secretions appropriately, no trismus  Eyes: Conjunctivae  and EOM are normal. Pupils are equal, round, and reactive to light.  Neck: Normal range of motion. Neck supple.  Cardiovascular: Normal rate, regular rhythm and normal heart sounds.   Pulmonary/Chest: Effort normal and breath sounds normal. No respiratory distress. He has no wheezes.  Musculoskeletal: Normal range of motion.  Neurological: He is alert and oriented to person, place, and time.  Skin: Skin is warm and dry.  Psychiatric: He has a normal  mood and affect.    ED Course  Procedures (including critical care time)  DIAGNOSTIC STUDIES: Oxygen Saturation is 100% on room air, normal by my interpretation.    COORDINATION OF CARE: At 1910 Discussed treatment plan with patient which includes PCN and Ultram. Patient agrees.   Labs Review Labs Reviewed - No data to display  Imaging Review No results found.   EKG Interpretation None      MDM   Final diagnoses:  Pain, dental   48 year old male complaining of diffuse dental pain. He does have an orange card but states he cannot get a dental appt until next month and cannot afford to pay out of pocket cost.  On exam he has very poor dentition with some mild gingival swelling, but no frank abscess.  Will start on abx and pain meds. In regards to ongoing rib pain, i ordered CXR for further evaluation but pt states "it's not that big of deal and i'll just wait until i see my doctor next week."  Pt is PERC negative without complaint of chest pain or SOB. Low suspicion for ACS, PE, dissection or other acute cardiac even at this time and will not require pt to sign out AMA.  I have strongly encouraged him to FU with his PCP next week as previously scheduled.  Discussed plan with patient, he/she acknowledged understanding and agreed with plan of care.  Return precautions given for new or worsening symptoms.  I personally performed the services described in this documentation, which was scribed in my presence. The recorded information has been reviewed and is accurate.  Larene Pickett, PA-C 11/29/13 2235

## 2013-11-29 NOTE — Discharge Instructions (Signed)
Take the prescribed medication as directed. °Follow-up with your primary care physician. °Return to the ED for new or worsening symptoms. ° °

## 2013-11-30 NOTE — ED Provider Notes (Signed)
Medical screening examination/treatment/procedure(s) were performed by non-physician practitioner and as supervising physician I was immediately available for consultation/collaboration.   EKG Interpretation None        Ezequiel Essex, MD 11/30/13 [phone]

## 2013-12-12 ENCOUNTER — Ambulatory Visit: Payer: Medicaid Other | Attending: Internal Medicine | Admitting: Internal Medicine

## 2013-12-12 ENCOUNTER — Encounter: Payer: Self-pay | Admitting: Internal Medicine

## 2013-12-12 VITALS — BP 150/88 | HR 78 | Temp 98.0°F | Resp 16 | Wt 216.6 lb

## 2013-12-12 DIAGNOSIS — G8929 Other chronic pain: Secondary | ICD-10-CM | POA: Insufficient documentation

## 2013-12-12 DIAGNOSIS — Z8619 Personal history of other infectious and parasitic diseases: Secondary | ICD-10-CM | POA: Diagnosis not present

## 2013-12-12 DIAGNOSIS — M549 Dorsalgia, unspecified: Secondary | ICD-10-CM | POA: Diagnosis present

## 2013-12-12 DIAGNOSIS — F17200 Nicotine dependence, unspecified, uncomplicated: Secondary | ICD-10-CM | POA: Insufficient documentation

## 2013-12-12 MED ORDER — TRAMADOL HCL 50 MG PO TABS: 50.0000 mg | ORAL_TABLET | Freq: Three times a day (TID) | ORAL | Status: DC | PRN

## 2013-12-12 NOTE — Progress Notes (Signed)
MRN: [id number] Name: Rodney Reed  Sex: male Age: 48 y.o. DOB: [birth year]  Allergies: Vancomycin and Fish allergy  Chief Complaint  Patient presents with  . Follow-up    HPI: Patient is 48 y.o. male who has history of chronic back pain has been referred to pain clinic several times and had been denied patient has orange card and could not afford, patient requesting pain medication as per patient he was taking oxycodone in the past, he was prescribed tramadol from our office, patient also has history of hepatitis C following up with ID.   Past Medical History  Diagnosis Date  . Back pain   . Cancer   . Cirrhosis   . Pancreatitis     History reviewed. No pertinent past surgical history.    Medication List       This list is accurate as of: 12/12/13 10:01 AM.  Always use your most recent med list.               ALPRAZolam 0.5 MG tablet  Commonly known as:  XANAX  Take 0.5 mg by mouth at bedtime as needed for anxiety or sleep.     GOODY HEADACHE PO  Take 1 packet by mouth daily as needed (headache).     oxyCODONE 5 MG immediate release tablet  Commonly known as:  ROXICODONE  Take 1-2 tabs every 6-8 hours for pain     penicillin v potassium 500 MG tablet  Commonly known as:  VEETID  Take 1 tablet (500 mg total) by mouth 4 (four) times daily.     traMADol 50 MG tablet  Commonly known as:  ULTRAM  Take 2 tablets (100 mg total) by mouth every 12 (twelve) hours as needed for moderate pain.     traMADol 50 MG tablet  Commonly known as:  ULTRAM  Take 1 tablet (50 mg total) by mouth every 6 (six) hours as needed.     traMADol 50 MG tablet  Commonly known as:  ULTRAM  Take 1 tablet (50 mg total) by mouth every 8 (eight) hours as needed for moderate pain.        Meds ordered this encounter  Medications  . traMADol (ULTRAM) 50 MG tablet    Sig: Take 1 tablet (50 mg total) by mouth every 8 (eight) hours as needed for moderate pain.    Dispense:  90 tablet   Refill:  0     There is no immunization history on file for this patient.  History reviewed. No pertinent family history.  History  Substance Use Topics  . Smoking status: Current Every Day Smoker -- 1.00 packs/day  . Smokeless tobacco: Not on file  . Alcohol Use: No     Comment: last use 11/21/12    Review of Systems   As noted in HPI  Filed Vitals:   12/12/13 0943  BP: 150/88  Pulse: 78  Temp: 98 F (36.7 C)  Resp: 16    Physical Exam  Physical Exam  Eyes: EOM are normal. Pupils are equal, round, and reactive to light.  Cardiovascular: Normal rate and regular rhythm.   Pulmonary/Chest: Breath sounds normal. No respiratory distress. He has no wheezes. He has no rales.  Musculoskeletal:  Lower spinal and paraspinal tenderness     CBC    Component Value Date/Time   WBC 6.6 08/22/2013 1510   RBC 4.84 08/22/2013 1510   HGB 14.1 08/22/2013 1510   HCT 41.7 08/22/2013 1510   PLT  89* 08/22/2013 1510   MCV 86.2 08/22/2013 1510   LYMPHSABS 1.5 08/22/2013 1510   MONOABS 0.5 08/22/2013 1510   EOSABS 0.3 08/22/2013 1510   BASOSABS 0.0 08/22/2013 1510    CMP     Component Value Date/Time   NA 136 08/22/2013 1510   K 4.3 08/22/2013 1510   CL 106 08/22/2013 1510   CO2 20 08/22/2013 1510   GLUCOSE 92 08/22/2013 1510   BUN 17 08/22/2013 1510   CREATININE 0.97 08/22/2013 1510   CREATININE 1.09 07/31/2013 2025   CALCIUM 9.3 08/22/2013 1510   PROT 7.6 08/22/2013 1510   ALBUMIN 4.4 08/22/2013 1510   AST 168* 08/22/2013 1510   ALT 233* 08/22/2013 1510   ALKPHOS 142* 08/22/2013 1510   BILITOT 0.7 08/22/2013 1510   GFRNONAA >89 08/22/2013 1510   GFRNONAA 80* 07/31/2013 2025   GFRAA >89 08/22/2013 1510   GFRAA >90 07/31/2013 2025    Lab Results  Component Value Date/Time   CHOL 201* 08/22/2013  3:10 PM    No components found with this basename: hga1c    Lab Results  Component Value Date/Time   AST 168* 08/22/2013  3:10 PM    Assessment and Plan  Chronic back pain - Plan: traMADol  (ULTRAM) 50 MG tablet, Ambulatory referral to Pain Clinic, Ambulatory referral to Orthopedic Surgery  Lorayne Marek, MD

## 2013-12-12 NOTE — Progress Notes (Signed)
Patient here for follow up on his back pain Has three lesions on his liver and need to be seen by a specialist Needs referral to pain management

## 2013-12-24 NOTE — Progress Notes (Signed)
Patient ID: Rodney Reed, male   DOB: 16-Nov-1965, 48 y.o.   MRN: [id number] Spoke to patient's mother, Rodney Reed.  Advised her that per Dr. Linus Salmons: he should get a referral from his PCP to GI/hepatology at [facility], [facility] or [place].  Hopefully, he can get into a study. She states she would rather have an established treatment option.  I advised her that per Dr. Linus Salmons, the patient has Genotype 3, which means we would need to wait for newer and better treatment options and we cannot get some of the drugs he needs for free.  She asked about Orange card paying for it, I told her that [facility] card will likely not cover $80K of Hepatitis C drugs.  I again mentioned contacting her PCP at [facility] for the referral to the aforementioned clinics.

## 2013-12-25 ENCOUNTER — Other Ambulatory Visit: Payer: Self-pay

## 2013-12-25 DIAGNOSIS — K769 Liver disease, unspecified: Secondary | ICD-10-CM

## 2014-01-10 ENCOUNTER — Ambulatory Visit: Payer: Medicaid Other | Attending: Internal Medicine | Admitting: Internal Medicine

## 2014-01-10 ENCOUNTER — Encounter: Payer: Self-pay | Admitting: Internal Medicine

## 2014-01-10 ENCOUNTER — Telehealth: Payer: Self-pay | Admitting: *Deleted

## 2014-01-10 VITALS — BP 125/72 | HR 79 | Temp 98.1°F | Resp 14 | Ht 72.0 in | Wt 212.0 lb

## 2014-01-10 DIAGNOSIS — G8929 Other chronic pain: Secondary | ICD-10-CM | POA: Insufficient documentation

## 2014-01-10 DIAGNOSIS — F909 Attention-deficit hyperactivity disorder, unspecified type: Secondary | ICD-10-CM | POA: Insufficient documentation

## 2014-01-10 DIAGNOSIS — F17200 Nicotine dependence, unspecified, uncomplicated: Secondary | ICD-10-CM | POA: Diagnosis not present

## 2014-01-10 DIAGNOSIS — M549 Dorsalgia, unspecified: Secondary | ICD-10-CM | POA: Insufficient documentation

## 2014-01-10 MED ORDER — TRAMADOL HCL 50 MG PO TABS: 50.0000 mg | ORAL_TABLET | Freq: Three times a day (TID) | ORAL | Status: DC | PRN

## 2014-01-10 NOTE — Progress Notes (Signed)
Patient presents for F/U on back pain. Rates 8/10 at present. States he has run out of all pain meds. Would like to discuss concerta for ADHD. Ptaient's mother present.

## 2014-01-10 NOTE — Telephone Encounter (Signed)
Samella Parr  [phone]) at [facility] called for advice.  Patient has applied for and been denied disability and medicaid.  He does have the orange card and the [facility] discount program.  Because of his coverage, Samella Parr could apply for  patient assistance.  Would you want to prescribe medication?  Are there any other tests or scans that need to be done?   Also, because of his [employer], Samella Parr may be able to have him seen in the [facility] hepatology clinic if this is more appropriate due to his status. Landis Gandy, RN

## 2014-01-10 NOTE — Telephone Encounter (Signed)
Hepatology would be better.  He has genotype 3 so would be ideal to wait for upcoming better treatments in 2016 but hepatology may have a study to get him in to.

## 2014-01-14 NOTE — Telephone Encounter (Signed)
Will route this back to [place].  Thanks.

## 2014-01-24 NOTE — Progress Notes (Signed)
Patient ID: Rodney Reed, male   DOB: December 21, 1965, 48 y.o.   MRN: [id number]  CC: back pain  HPI: Patient presents to clinic today for a follow up of back pain.  Patient reports that he continues to have lower back pain that is unrelieved by tramadol.  Patient is very upset that he has been denied by several pain clinics for management of his back pain.  Patient denies any bowel or bladder dysfunction.  Patient reports that he knows he has concentration issues and needs to be placed on Concerta or Adderall.  When explained to patient that we do not manage ADHD at this clinic and he should follow up with [facility] for evaluation and testing he became very angry.  Patient stated that he knows I can at least give Concerta because it does contain amphetamine.  Patient asked for every ADHD drug by name during visit.  Patient reports that he does not have the patience to sit and talk to a psychiatrist for evaluation.  Patient was very loud, argumentative, and aggressive during visit.  Patient refused review of system and physical assessment.  Patient became aggressive and demanded tramadol before he gets angry.     Allergies  Allergen Reactions  . Vancomycin Anaphylaxis  . Fish Allergy Other (See Comments)    Hot flash, dizziness   Past Medical History  Diagnosis Date  . Back pain   . Cancer   . Cirrhosis   . Pancreatitis    Current Outpatient Prescriptions on File Prior to Visit  Medication Sig Dispense Refill  . Aspirin-Acetaminophen-Caffeine (GOODY HEADACHE PO) Take 1 packet by mouth daily as needed (headache).      . ALPRAZolam (XANAX) 0.5 MG tablet Take 0.5 mg by mouth at bedtime as needed for anxiety or sleep.      Marland Kitchen oxyCODONE (ROXICODONE) 5 MG immediate release tablet Take 1-2 tabs every 6-8 hours for pain  10 tablet  0  . penicillin v potassium (VEETID) 500 MG tablet Take 1 tablet (500 mg total) by mouth 4 (four) times daily.  40 tablet  0  . traMADol (ULTRAM) 50 MG tablet Take 2 tablets (100  mg total) by mouth every 12 (twelve) hours as needed for moderate pain.  120 tablet  0  . traMADol (ULTRAM) 50 MG tablet Take 1 tablet (50 mg total) by mouth every 6 (six) hours as needed.  15 tablet  0  . traMADol (ULTRAM) 50 MG tablet Take 1 tablet (50 mg total) by mouth every 8 (eight) hours as needed for moderate pain.  90 tablet  0   No current facility-administered medications on file prior to visit.   Family History  Problem Relation Age of Onset  . Hypertension Mother   . Diabetes Mother    History   Social History  . Marital Status: Legally Separated    Spouse Name: N/A    Number of Children: N/A  . Years of Education: N/A   Occupational History  . Not on file.   Social History Main Topics  . Smoking status: Current Every Day Smoker -- 1.00 packs/day  . Smokeless tobacco: Not on file  . Alcohol Use: No     Comment: last use 11/21/12  . Drug Use: No     Comment:  hx of heroin use; sts former use  . Sexual Activity: Not on file   Other Topics Concern  . Not on file   Social History Narrative  . No narrative on file  Review of Systems: Unable to preform, patient refused   Objective:   Filed Vitals:   01/10/14 1430  BP: 125/72  Pulse: 79  Temp: 98.1 F (36.7 C)  Resp: 14    Physical Exam: Unable to perform patient refused.  Lab Results  Component Value Date   WBC 6.6 08/22/2013   HGB 14.1 08/22/2013   HCT 41.7 08/22/2013   MCV 86.2 08/22/2013   PLT 89* 08/22/2013   Lab Results  Component Value Date   CREATININE 0.97 08/22/2013   BUN 17 08/22/2013   NA 136 08/22/2013   K 4.3 08/22/2013   CL 106 08/22/2013   CO2 20 08/22/2013    No results found for this basename: HGBA1C   Lipid Panel     Component Value Date/Time   CHOL 201* 08/22/2013 1510   TRIG 101 08/22/2013 1510   HDL 55 08/22/2013 1510   CHOLHDL 3.7 08/22/2013 1510   VLDL 20 08/22/2013 1510   LDLCALC 126* 08/22/2013 1510       Assessment and plan:   Rodney Reed was seen today for follow-up,  back pain and adhd.  Diagnoses and associated orders for this visit:  Chronic back pain - traMADol (ULTRAM) 50 MG tablet; Take 1 tablet (50 mg total) by mouth every 8 (eight) hours as needed. Patient given tramadol referral and was told that he will not get any refills unless examined. Explained to patient that he may use the ER or other facilities if he continues to become aggressive at our office.  I have high suspicion of drug seeking due to conversation and behavior at today's visit as well as previous visit.         Chari Manning, [facility] [phone] 01/24/2014, 10:33 PM

## 2014-01-31 ENCOUNTER — Other Ambulatory Visit: Payer: Self-pay | Admitting: Internal Medicine

## 2014-02-19 ENCOUNTER — Encounter: Payer: Self-pay | Admitting: Internal Medicine

## 2014-02-19 ENCOUNTER — Ambulatory Visit: Payer: Medicaid Other | Attending: Internal Medicine | Admitting: Internal Medicine

## 2014-02-19 VITALS — BP 136/76 | HR 77 | Temp 98.5°F | Resp 16 | Ht 72.0 in | Wt 206.8 lb

## 2014-02-19 DIAGNOSIS — F17200 Nicotine dependence, unspecified, uncomplicated: Secondary | ICD-10-CM | POA: Diagnosis not present

## 2014-02-19 DIAGNOSIS — G8929 Other chronic pain: Secondary | ICD-10-CM | POA: Insufficient documentation

## 2014-02-19 DIAGNOSIS — K7460 Unspecified cirrhosis of liver: Secondary | ICD-10-CM | POA: Diagnosis not present

## 2014-02-19 DIAGNOSIS — M549 Dorsalgia, unspecified: Secondary | ICD-10-CM | POA: Diagnosis present

## 2014-02-19 MED ORDER — TRAMADOL HCL 50 MG PO TABS: 100.0000 mg | ORAL_TABLET | Freq: Two times a day (BID) | ORAL | Status: DC | PRN

## 2014-02-19 NOTE — Progress Notes (Signed)
Patient ID: Rodney Reed, male   DOB: Dec 31, 1965, 48 y.o.   MRN: [id number]  CC:  Back pain  HPI:  Patient presents today for a follow up of back pain.  Patient reports that he continues to have back pain and has been using tramadol for pain.  He has been denied by several pain clinic due to history of drug use.  He denies bowel and bladder dysfunction.  Patient would like a refill of Tramadol at time.  He reports that he seen the Infectious Disease doctor and was told that he will need to see hepatology due to advancement of liver disease.   His pain continues to be in his lower back and the pain in his thoracic spine causes a burning sensation in his right arm.    Allergies  Allergen Reactions  . Vancomycin Anaphylaxis  . Fish Allergy Other (See Comments)    Hot flash, dizziness   Past Medical History  Diagnosis Date  . Back pain   . Cancer   . Cirrhosis   . Pancreatitis    Current Outpatient Prescriptions on File Prior to Visit  Medication Sig Dispense Refill  . traMADol (ULTRAM) 50 MG tablet Take 1 tablet (50 mg total) by mouth every 8 (eight) hours as needed.  60 tablet  0  . ALPRAZolam (XANAX) 0.5 MG tablet Take 0.5 mg by mouth at bedtime as needed for anxiety or sleep.      . Aspirin-Acetaminophen-Caffeine (GOODY HEADACHE PO) Take 1 packet by mouth daily as needed (headache).      . oxyCODONE (ROXICODONE) 5 MG immediate release tablet Take 1-2 tabs every 6-8 hours for pain  10 tablet  0  . penicillin v potassium (VEETID) 500 MG tablet Take 1 tablet (500 mg total) by mouth 4 (four) times daily.  40 tablet  0  . traMADol (ULTRAM) 50 MG tablet Take 2 tablets (100 mg total) by mouth every 12 (twelve) hours as needed for moderate pain.  120 tablet  0  . traMADol (ULTRAM) 50 MG tablet Take 1 tablet (50 mg total) by mouth every 6 (six) hours as needed.  15 tablet  0  . traMADol (ULTRAM) 50 MG tablet Take 1 tablet (50 mg total) by mouth every 8 (eight) hours as needed for moderate pain.  90  tablet  0   No current facility-administered medications on file prior to visit.   Family History  Problem Relation Age of Onset  . Hypertension Mother   . Diabetes Mother    History   Social History  . Marital Status: Legally Separated    Spouse Name: N/A    Number of Children: N/A  . Years of Education: N/A   Occupational History  . Not on file.   Social History Main Topics  . Smoking status: Current Every Day Smoker -- 1.00 packs/day  . Smokeless tobacco: Not on file  . Alcohol Use: No     Comment: last use 11/21/12  . Drug Use: No     Comment:  hx of heroin use; sts former use  . Sexual Activity: Not on file   Other Topics Concern  . Not on file   Social History Narrative  . No narrative on file   Review of Systems  Respiratory: Negative.   Cardiovascular: Negative.   Gastrointestinal: Negative.   Genitourinary: Negative.   Musculoskeletal: Positive for back pain.     Objective:   Filed Vitals:   02/19/14 1444  BP: 136/76  Pulse:  77  Temp: 98.5 F (36.9 C)  Resp: 16   Physical Exam  Neck: Normal range of motion.  Cardiovascular: Normal rate, regular rhythm and normal heart sounds.   Pulmonary/Chest: Effort normal and breath sounds normal.  Abdominal: Soft. Bowel sounds are normal.  Musculoskeletal: He exhibits tenderness. He exhibits no edema.  Lower spinal tenderness  Neurological: He is alert. He has normal reflexes.   .  Lab Results  Component Value Date   WBC 6.6 08/22/2013   HGB 14.1 08/22/2013   HCT 41.7 08/22/2013   MCV 86.2 08/22/2013   PLT 89* 08/22/2013   Lab Results  Component Value Date   CREATININE 0.97 08/22/2013   BUN 17 08/22/2013   NA 136 08/22/2013   K 4.3 08/22/2013   CL 106 08/22/2013   CO2 20 08/22/2013    No results found for this basename: HGBA1C   Lipid Panel     Component Value Date/Time   CHOL 201* 08/22/2013 1510   TRIG 101 08/22/2013 1510   HDL 55 08/22/2013 1510   CHOLHDL 3.7 08/22/2013 1510   VLDL 20  08/22/2013 1510   LDLCALC 126* 08/22/2013 1510       Assessment and plan:   Rodney Reed was seen today for follow-up and back pain.  Diagnoses and associated orders for this visit:  Chronic back pain - traMADol (ULTRAM) 50 MG tablet; Take 2 tablets (100 mg total) by mouth every 12 (twelve) hours as needed for moderate pain. Patient wants 90 pills of tramadol and I advised him that he has had multiple prescriptions of Tramadol and Oxycodone in the past and that he was running out of medication to quickly, so therefore he will not be given a large quantity of pain medication.    Patient reports that he was approved for medicaid and will receive his card soon, once he receives card he will call back for referral to pain management.    Return if symptoms worsen or fail to improve.    Chari Manning, NP-C [facility] [phone] 02/19/2014, 3:19 PM

## 2014-02-19 NOTE — Progress Notes (Signed)
Patient presents for f/u on back pain; rates 8/10 at present States tramadol "helps a little" Mother present PHQ-9 score of 15. Patient states he sees counselor for depression and anxiety

## 2014-02-19 NOTE — Patient Instructions (Signed)
Back Pain, Adult Low back pain is very common. About 1 in 5 people have back pain.The cause of low back pain is rarely dangerous. The pain often gets better over time.About half of people with a sudden onset of back pain feel better in just 2 weeks. About 8 in 10 people feel better by 6 weeks.  CAUSES Some common causes of back pain include:  Strain of the muscles or ligaments supporting the spine.  Wear and tear (degeneration) of the spinal discs.  Arthritis.  Direct injury to the back. DIAGNOSIS Most of the time, the direct cause of low back pain is not known.However, back pain can be treated effectively even when the exact cause of the pain is unknown.Answering your caregiver's questions about your overall health and symptoms is one of the most accurate ways to make sure the cause of your pain is not dangerous. If your caregiver needs more information, he or she may order lab work or imaging tests (X-rays or MRIs).However, even if imaging tests show changes in your back, this usually does not require surgery. HOME CARE INSTRUCTIONS For many people, back pain returns.Since low back pain is rarely dangerous, it is often a condition that people can learn to manageon their own.   Remain active. It is stressful on the back to sit or stand in one place. Do not sit, drive, or stand in one place for more than 30 minutes at a time. Take short walks on level surfaces as soon as pain allows.Try to increase the length of time you walk each day.  Do not stay in bed.Resting more than 1 or 2 days can delay your recovery.  Do not avoid exercise or work.Your body is made to move.It is not dangerous to be active, even though your back may hurt.Your back will likely heal faster if you return to being active before your pain is gone.  Pay attention to your body when you bend and lift. Many people have less discomfortwhen lifting if they bend their knees, keep the load close to their bodies,and  avoid twisting. Often, the most comfortable positions are those that put less stress on your recovering back.  Find a comfortable position to sleep. Use a firm mattress and lie on your side with your knees slightly bent. If you lie on your back, put a pillow under your knees.  Only take over-the-counter or prescription medicines as directed by your caregiver. Over-the-counter medicines to reduce pain and inflammation are often the most helpful.Your caregiver may prescribe muscle relaxant drugs.These medicines help dull your pain so you can more quickly return to your normal activities and healthy exercise.  Put ice on the injured area.  Put ice in a plastic bag.  Place a towel between your skin and the bag.  Leave the ice on for 15-20 minutes, 03-04 times a day for the first 2 to 3 days. After that, ice and heat may be alternated to reduce pain and spasms.  Ask your caregiver about trying back exercises and gentle massage. This may be of some benefit.  Avoid feeling anxious or stressed.Stress increases muscle tension and can worsen back pain.It is important to recognize when you are anxious or stressed and learn ways to manage it.Exercise is a great option. SEEK MEDICAL CARE IF:  You have pain that is not relieved with rest or medicine.  You have pain that does not improve in 1 week.  You have new symptoms.  You are generally not feeling well. SEEK   IMMEDIATE MEDICAL CARE IF:   You have pain that radiates from your back into your legs.  You develop new bowel or bladder control problems.  You have unusual weakness or numbness in your arms or legs.  You develop nausea or vomiting.  You develop abdominal pain.  You feel faint. Document Released: 06/28/2005 Document Revised: 12/28/2011 Document Reviewed: 10/30/2013 ExitCare Patient Information 2015 [employer]. This information is not intended to replace advice given to you by your health care provider. Make sure you  discuss any questions you have with your health care provider.  

## 2014-03-19 ENCOUNTER — Ambulatory Visit: Payer: No Typology Code available for payment source | Admitting: Internal Medicine

## 2014-08-22 ENCOUNTER — Ambulatory Visit: Payer: Medicaid Other | Admitting: Internal Medicine

## 2014-09-05 ENCOUNTER — Encounter: Payer: Self-pay | Admitting: Internal Medicine

## 2014-09-05 ENCOUNTER — Ambulatory Visit (INDEPENDENT_AMBULATORY_CARE_PROVIDER_SITE_OTHER): Payer: Medicaid Other | Admitting: Internal Medicine

## 2014-09-05 VITALS — BP 151/91 | HR 73 | Temp 97.8°F | Ht 72.0 in | Wt 206.5 lb

## 2014-09-05 DIAGNOSIS — B182 Chronic viral hepatitis C: Secondary | ICD-10-CM

## 2014-09-05 DIAGNOSIS — K7460 Unspecified cirrhosis of liver: Secondary | ICD-10-CM

## 2014-09-05 MED ORDER — SOFOSBUVIR 400 MG PO TABS: 400.0000 mg | ORAL_TABLET | Freq: Every day | ORAL | Status: DC

## 2014-09-05 MED ORDER — DACLATASVIR DIHYDROCHLORIDE 60 MG PO TABS: 60.0000 mg | ORAL_TABLET | Freq: Every day | ORAL | Status: DC

## 2014-09-05 NOTE — Assessment & Plan Note (Signed)
I'm going to update his labs as well as prescribe Daclinza with sofosbuvir.  This will be for 12 weeks.

## 2014-09-05 NOTE — Progress Notes (Signed)
   Subjective:    Patient ID: Rodney Reed, male    DOB: 12/04/1965, 49 y.o.   MRN: [id number]  HPI He comes in for follow-up of hepatitis C. He was seen in April 2015 with noted cirrhosis and hepatitis C with genotype 3. At that time he was uninsured and medications were not available at that time. However since that time, he has received Medicaid as well as having a new hepatitis C genotype 3 medication available that is much more effective in people with cirrhosis. He has never been treated before. Cirrhosis was noted on a CT scan in the past and he denies a history of drug use.  His viral load last year was 384,000 and a repeat done was 80,000 copies. He had significant transaminitis.     Also [facility] with cirrhosis and 2 small areas of the left lobe that were poorly characterized but possible HCC.   Review of Systems     Objective:   Physical Exam        Assessment & Plan:

## 2014-09-05 NOTE — Assessment & Plan Note (Signed)
I will have him referred to gastroenterology for cirrhosis evaluation and possible EGD.

## 2014-09-05 NOTE — Patient Instructions (Signed)
Date 09/05/2014  Dear Rodney Reed, As discussed in the [facility], your hepatitis C therapy will include the following medications:     sofosbuvir 400 mg oral daily + daclatasvir 60 mg oral daily  Please note that ALL MEDICATIONS WILL START ON THE SAME DATE for a total of 12 weeks. ---------------------------------------------------------------- Your HCV Treatment Start Date: TBA   Your HCV genotype: 3    Liver Fibrosis: cirrhosis  ---------------------------------------------------------------- YOUR PHARMACY CONTACT:   [place] [facility] Phone: [phone] Hours: Monday to Friday 7:30 am to 6:00 pm   Please always contact your pharmacy at least 3-4 business days before you run out of medications to ensure your next month's medication is ready or 1 week prior to running out if you receive it by mail.  Remember, each prescription is for 28 days. ---------------------------------------------------------------- GENERAL NOTES REGARDING YOUR HEPATITIS C MEDICATION:  SOFOSBUVIR (SOVALDI): - Sofosbuvir 400 mg tablet is taken daily with OR without food. - The tablets are yellow. - The tablets should be stored at room temperature. - The common side effects with sofosbuvir:      1. Fatigue      2. Headache  - Acid reducing agents such as H2 blockers (ie. Pepcid (famotidine), Zantac (ranitidine), Tagamet (cimetidine), Axid (nizatidine) and proton pump inhibitors (ie. Prilosec (omeprazole), Protonix (pantoprazole), Nexium (esomeprazole), or Aciphex (rabeprazole)) can decrease effectiveness of Harvoni. Do not take until you have discussed with a health care provider.    -Antacids that contain magnesium and/or aluminum hydroxide (ie. Milk of Magensia, Rolaids, Gaviscon, Maalox, Mylanta, an dArthritis Pain Formula)can reduce absorption of sofosbuvir, so take them at least 4 hours before or after Harvoni.  -Calcium  carbonate (calcium supplements or antacids such as Tums, Caltrate, Os-Cal)needs to be taken at least 4 hours hours before or after sofosbuvir.  -St. John's wort or any products that contain St. John's wort like some herbal supplements  Please inform the office prior to starting any of these medications.  - The common side effects with sofosbuvir:      1. Fatigue      2. Headache      3. Nausea      4. Diarrhea      5. Insomnia  DACLATASVIR [facility]) - The common side effects with daclatasvir:      1. Fatigue      2. Headache   Support Path is a suite of resources designed to help patients start with HARVONI and SOVALDI move toward treatment completion [name] helps patients access therapy and get off to an efficient start  Benefits investigation and prior authorization support Co-pay and other financial assistance A specialty pharmacy finder CO-PAY COUPON The sofosbuvir co-pay coupon may help eligible patients lower their out-of-pocket costs. With a co-pay coupon, most eligible patients may pay no more than $5 per co-pay (restrictions apply) www.harvoni.com call (914)266-5529 Not valid for patients enrolled in government healthcare prescription drug programs, such as Medicare Part D and Medicaid. Patients in the coverage gap known as the "donut hole" also are not eligible   Please note that this only lists the most common side effects and is NOT a comprehensive list of the potential side effects of these medications. For more information, please review the drug information sheets that come with your medication package from the pharmacy.  ---------------------------------------------------------------- GENERAL HELPFUL HINTS ON HCV THERAPY: 1. No alcohol. 2. Protect against sun-sensitivity/sunburns (wear sunglasses, hat, long  sleeves, pants and sunscreen). 3. Stay well-hydrated/well-moisturized. 4. Notify the [facility] of any changes in your other  over-the-counter/herbal or prescription medications. 5. If you miss a dose of your medication, take the missed dose as soon as you remember. Return to your regular time/dose schedule the next day.  6.  Do not stop taking your medications without first talking with your healthcare provider. 7.  You may take Tylenol (acetaminophen), as long as the dose is less than 2000 mg (OR no more than 4 tablets of the Tylenol Extra Strengths 500mg  tablet) in 24 hours. 8.  You will need to obtain routine labs and/or office visits at [facility] at weeks 2, 4, 8,  and 12 as well as 12 and 24 weeks after completion of treatment.   [name], [name] [place], [place]  [phone]

## 2014-09-06 ENCOUNTER — Telehealth: Payer: Self-pay | Admitting: *Deleted

## 2014-09-06 ENCOUNTER — Encounter: Payer: Self-pay | Admitting: Internal Medicine

## 2014-09-06 LAB — COMPLETE METABOLIC PANEL WITH GFR
ALBUMIN: 3.8 g/dL (ref 3.5–5.2)
ALK PHOS: 142 U/L — AB (ref 39–117)
ALT: 38 U/L (ref 0–53)
AST: 60 U/L — ABNORMAL HIGH (ref 0–37)
BILIRUBIN TOTAL: 0.8 mg/dL (ref 0.2–1.2)
BUN: 12 mg/dL (ref 6–23)
CO2: 23 mEq/L (ref 19–32)
Calcium: 9.2 mg/dL (ref 8.4–10.5)
Chloride: 106 mEq/L (ref 96–112)
Creat: 1.04 mg/dL (ref 0.50–1.35)
GFR, EST NON AFRICAN AMERICAN: 85 mL/min
GFR, Est African American: >89 mL/min
GLUCOSE: 111 mg/dL — AB (ref 70–99)
Potassium: 4.2 mEq/L (ref 3.5–5.3)
Sodium: 138 mEq/L (ref 135–145)
Total Protein: 7.5 g/dL (ref 6.0–8.3)

## 2014-09-06 LAB — PROTIME-INR
INR: 1.24 (ref <1.50)
Prothrombin Time: 15.6 seconds — ABNORMAL HIGH (ref 11.6–15.2)

## 2014-09-06 LAB — CBC WITH DIFFERENTIAL/PLATELET
Basophils Absolute: 0 10*3/uL (ref 0.0–0.1)
Basophils Relative: 1 % (ref 0–1)
Eosinophils Absolute: 0.3 10*3/uL (ref 0.0–0.7)
Eosinophils Relative: 8 % — ABNORMAL HIGH (ref 0–5)
HEMATOCRIT: 39.8 % (ref 39.0–52.0)
Hemoglobin: 13.5 g/dL (ref 13.0–17.0)
LYMPHS ABS: 0.7 10*3/uL (ref 0.7–4.0)
Lymphocytes Relative: 21 % (ref 12–46)
MCH: 30.2 pg (ref 26.0–34.0)
MCHC: 33.9 g/dL (ref 30.0–36.0)
MCV: 89 fL (ref 78.0–100.0)
MONOS PCT: 9 % (ref 3–12)
MPV: 11.2 fL (ref 8.6–12.4)
Monocytes Absolute: 0.3 10*3/uL (ref 0.1–1.0)
NEUTROS ABS: 2.1 10*3/uL (ref 1.7–7.7)
NEUTROS PCT: 61 % (ref 43–77)
RBC: 4.47 MIL/uL (ref 4.22–5.81)
RDW: 15.1 % (ref 11.5–15.5)
WBC: 3.5 10*3/uL — AB (ref 4.0–10.5)

## 2014-09-06 LAB — HEPATITIS A ANTIBODY, TOTAL: Hep A Total Ab: NONREACTIVE

## 2014-09-06 LAB — HIV ANTIBODY (ROUTINE TESTING W REFLEX): HIV: NONREACTIVE

## 2014-09-06 LAB — HEPATITIS B SURFACE ANTIGEN: Hepatitis B Surface Ag: NEGATIVE

## 2014-09-06 LAB — HEPATITIS B SURFACE ANTIBODY,QUALITATIVE

## 2014-09-06 LAB — IRON: IRON: 48 ug/dL (ref 42–165)

## 2014-09-06 LAB — HEPATITIS B CORE ANTIBODY, TOTAL: HEP B C TOTAL AB: NONREACTIVE

## 2014-09-06 NOTE — Telephone Encounter (Signed)
MRI appt - March 16 arrive at 10:45 at [facility], [place] after midnight.  Information read back.

## 2014-09-09 LAB — HCV RNA QUANT RFLX ULTRA OR GENOTYP
HCV Quantitative Log: 4.62 {Log} — ABNORMAL HIGH (ref <1.18)
HCV Quantitative: 41595 IU/mL — ABNORMAL HIGH (ref <15)

## 2014-09-11 LAB — HEPATITIS C GENOTYPE: HCV Genotype: 3

## 2014-09-17 ENCOUNTER — Other Ambulatory Visit: Payer: Self-pay | Admitting: Internal Medicine

## 2014-09-17 ENCOUNTER — Telehealth: Payer: Self-pay | Admitting: *Deleted

## 2014-09-17 DIAGNOSIS — B1920 Unspecified viral hepatitis C without hepatic coma: Secondary | ICD-10-CM

## 2014-09-17 NOTE — Telephone Encounter (Signed)
Called patient and left voice mail to call the clinic to schedule a lab appt in 4 weeks and follow up with Dr. Linus Salmons in approx 5 weeks. Labs added [employer]

## 2014-09-25 ENCOUNTER — Ambulatory Visit (HOSPITAL_COMMUNITY)
Admission: RE | Admit: 2014-09-25 | Discharge: 2014-09-25 | Disposition: A | Discharge status: Home / Self Care | Payer: Medicaid Other | Source: Ambulatory Visit | Attending: Internal Medicine | Admitting: Internal Medicine

## 2014-09-25 DIAGNOSIS — K7469 Other cirrhosis of liver: Secondary | ICD-10-CM | POA: Insufficient documentation

## 2014-09-25 DIAGNOSIS — R161 Splenomegaly, not elsewhere classified: Secondary | ICD-10-CM | POA: Diagnosis not present

## 2014-09-25 DIAGNOSIS — K7460 Unspecified cirrhosis of liver: Secondary | ICD-10-CM | POA: Diagnosis present

## 2014-09-25 MED ORDER — GADOXETATE DISODIUM 0.25 MMOL/ML IV SOLN: 10.0000 mL | Freq: Once | INTRAVENOUS | Status: AC | PRN

## 2014-09-26 ENCOUNTER — Other Ambulatory Visit: Payer: Self-pay | Admitting: Internal Medicine

## 2014-09-26 DIAGNOSIS — K7460 Unspecified cirrhosis of liver: Secondary | ICD-10-CM

## 2014-09-27 ENCOUNTER — Telehealth: Payer: Self-pay | Admitting: *Deleted

## 2014-09-27 NOTE — Telephone Encounter (Signed)
-----   Message from Thayer Headings, MD sent at 09/26/2014  8:44 AM EDT ----- Please also do an AFP (alpha feta protein) with his next labs. thanks

## 2014-09-27 NOTE — Telephone Encounter (Signed)
Per Dr. Linus Salmons he wants to add this lab to patient's next lab appointment for Hep C. It has been ordered and Santiago Glad, [employer] manager, was notified. Myrtis Hopping

## 2014-10-02 ENCOUNTER — Encounter (HOSPITAL_COMMUNITY): Payer: Self-pay | Admitting: Emergency Medicine

## 2014-10-02 ENCOUNTER — Emergency Department (HOSPITAL_COMMUNITY)
Admission: EM | Admit: 2014-10-02 | Discharge: 2014-10-03 | Disposition: A | Discharge status: Home / Self Care | Payer: Medicaid Other | Attending: Emergency Medicine | Admitting: Emergency Medicine

## 2014-10-02 DIAGNOSIS — Z792 Long term (current) use of antibiotics: Secondary | ICD-10-CM | POA: Diagnosis not present

## 2014-10-02 DIAGNOSIS — Z720 Tobacco use: Secondary | ICD-10-CM | POA: Insufficient documentation

## 2014-10-02 DIAGNOSIS — Z859 Personal history of malignant neoplasm, unspecified: Secondary | ICD-10-CM | POA: Diagnosis not present

## 2014-10-02 DIAGNOSIS — R1011 Right upper quadrant pain: Secondary | ICD-10-CM | POA: Insufficient documentation

## 2014-10-02 DIAGNOSIS — Z8719 Personal history of other diseases of the digestive system: Secondary | ICD-10-CM | POA: Diagnosis not present

## 2014-10-02 DIAGNOSIS — R079 Chest pain, unspecified: Secondary | ICD-10-CM | POA: Diagnosis present

## 2014-10-02 DIAGNOSIS — Z79899 Other long term (current) drug therapy: Secondary | ICD-10-CM | POA: Diagnosis not present

## 2014-10-02 NOTE — ED Notes (Signed)
Patient here with complaint of severe abdominal and chest pain accompanied by constant nausea and vomiting. States history of liver cancer and started taking medicine for it about one week ago. When asked patient is unsure of whether it is chemo therapy. Suddenly today is entire abdomen became very painful and he began to vomit.

## 2014-10-03 ENCOUNTER — Emergency Department (HOSPITAL_COMMUNITY): Payer: Medicaid Other

## 2014-10-03 ENCOUNTER — Encounter (HOSPITAL_COMMUNITY): Payer: Self-pay | Admitting: Emergency Medicine

## 2014-10-03 LAB — I-STAT TROPONIN, ED: Troponin i, poc: 0 ng/mL (ref 0.00–0.08)

## 2014-10-03 LAB — BASIC METABOLIC PANEL
Anion gap: 10 (ref 5–15)
BUN: 13 mg/dL (ref 6–23)
CHLORIDE: 106 mmol/L (ref 96–112)
CO2: 24 mmol/L (ref 19–32)
CREATININE: 1.19 mg/dL (ref 0.50–1.35)
Calcium: 9.9 mg/dL (ref 8.4–10.5)
GFR calc non Af Amer: 71 mL/min — ABNORMAL LOW (ref >90)
GFR, EST AFRICAN AMERICAN: 83 mL/min — AB (ref >90)
Glucose, Bld: 133 mg/dL — ABNORMAL HIGH (ref 70–99)
Potassium: 4 mmol/L (ref 3.5–5.1)
Sodium: 140 mmol/L (ref 135–145)

## 2014-10-03 LAB — CBC
HEMATOCRIT: 43.8 % (ref 39.0–52.0)
HEMOGLOBIN: 14.9 g/dL (ref 13.0–17.0)
MCH: 30.5 pg (ref 26.0–34.0)
MCHC: 34 g/dL (ref 30.0–36.0)
MCV: 89.8 fL (ref 78.0–100.0)
Platelets: 112 10*3/uL — ABNORMAL LOW (ref 150–400)
RBC: 4.88 MIL/uL (ref 4.22–5.81)
RDW: 14.3 % (ref 11.5–15.5)
WBC: 11.6 10*3/uL — AB (ref 4.0–10.5)

## 2014-10-03 LAB — COMPREHENSIVE METABOLIC PANEL
ALBUMIN: 4.2 g/dL (ref 3.5–5.2)
ALT: 106 U/L — AB (ref 0–53)
AST: 112 U/L — AB (ref 0–37)
Alkaline Phosphatase: 160 U/L — ABNORMAL HIGH (ref 39–117)
Anion gap: 11 (ref 5–15)
BILIRUBIN TOTAL: 2.2 mg/dL — AB (ref 0.3–1.2)
BUN: 10 mg/dL (ref 6–23)
CHLORIDE: 105 mmol/L (ref 96–112)
CO2: 24 mmol/L (ref 19–32)
Calcium: 10 mg/dL (ref 8.4–10.5)
Creatinine, Ser: 1.27 mg/dL (ref 0.50–1.35)
GFR calc Af Amer: 76 mL/min — ABNORMAL LOW (ref >90)
GFR calc non Af Amer: 66 mL/min — ABNORMAL LOW (ref >90)
Glucose, Bld: 140 mg/dL — ABNORMAL HIGH (ref 70–99)
Potassium: 4.2 mmol/L (ref 3.5–5.1)
Sodium: 140 mmol/L (ref 135–145)
Total Protein: 8.5 g/dL — ABNORMAL HIGH (ref 6.0–8.3)

## 2014-10-03 LAB — I-STAT CG4 LACTIC ACID, ED: LACTIC ACID, VENOUS: 2.07 mmol/L — AB (ref 0.5–2.0)

## 2014-10-03 LAB — PROTIME-INR
INR: 1.2 (ref 0.00–1.49)
Prothrombin Time: 15.3 s — ABNORMAL HIGH (ref 11.6–15.2)

## 2014-10-03 LAB — ETHANOL: Alcohol, Ethyl (B): <5 mg/dL (ref 0–9)

## 2014-10-03 LAB — LIPASE, BLOOD: Lipase: 45 U/L (ref 11–59)

## 2014-10-03 MED ORDER — MORPHINE SULFATE 4 MG/ML IJ SOLN
6.0000 mg | Freq: Once | INTRAMUSCULAR | Status: AC
Start: 2014-10-03 — End: 2014-10-03
  Administered 2014-10-03: 6 mg via INTRAVENOUS
  Filled 2014-10-03: qty 2

## 2014-10-03 MED ORDER — HYDROMORPHONE HCL 1 MG/ML IJ SOLN
1.0000 mg | INTRAMUSCULAR | Status: DC | PRN
  Administered 2014-10-03: 1 mg via INTRAVENOUS
  Filled 2014-10-03: qty 1

## 2014-10-03 MED ORDER — SODIUM CHLORIDE 0.9 % IV BOLUS (SEPSIS)
1000.0000 mL | Freq: Once | INTRAVENOUS | Status: AC
  Administered 2014-10-03: 1000 mL via INTRAVENOUS

## 2014-10-03 MED ORDER — ONDANSETRON HCL 4 MG/2ML IJ SOLN
4.0000 mg | Freq: Once | INTRAMUSCULAR | Status: AC
  Administered 2014-10-03: 4 mg via INTRAVENOUS
  Filled 2014-10-03: qty 2

## 2014-10-03 MED ORDER — ONDANSETRON HCL 4 MG/2ML IJ SOLN: 4.0000 mg | Freq: Once | INTRAMUSCULAR | Status: DC

## 2014-10-03 MED ORDER — HYDROMORPHONE HCL 1 MG/ML IJ SOLN
1.0000 mg | Freq: Once | INTRAMUSCULAR | Status: AC
  Administered 2014-10-03: 1 mg via INTRAVENOUS
  Filled 2014-10-03: qty 1

## 2014-10-03 NOTE — ED Notes (Signed)
Discharge instructions, voiced understanding

## 2014-10-03 NOTE — ED Provider Notes (Signed)
CSN: [id number]     Arrival date & time 10/02/14  2338 History   First MD Initiated Contact with Patient 10/02/14 2358     This chart was scribed for Everlene Balls, MD by Forrestine Him, ED Scribe. This patient was seen in room B15C/B15C and the patient's care was started 12:11 AM.   Chief Complaint  Patient presents with  . Chest Pain  . Abdominal Pain   HPI  HPI Comments: Benjamim Harnish is a 49 y.o. male with a PMHx of cirrhosis, cancer, pancreatitis who presents to the Emergency Department complaining of constant, worsening chest pain and abdominal pain x 4-5 hours while at rest. Pain is described as sharp/burning. No aggravating or alleviating factors at this time Pt also reports. 4 episodes of vomiting along with diarrhea,chills, and urinary urgency. No recent fever. Mr. Grosser is currently undergoing chemo treatments via capsules. He is currently enrolled in a pain clinic and starts management next month.  Pt is followed by Dr. Beatrice Lecher in [place], [place]  Past Medical History  Diagnosis Date  . Back pain   . Cancer   . Cirrhosis   . Pancreatitis    Past Surgical History  Procedure Laterality Date  . Tonsillectomy Bilateral as infant   Family History  Problem Relation Age of Onset  . Hypertension Mother   . Diabetes Mother    History  Substance Use Topics  . Smoking status: Current Every Day Smoker -- 0.50 packs/day for 33 years    Types: Cigarettes  . Smokeless tobacco: Not on file     Comment: trying to cut back  . Alcohol Use: No     Comment: last use 11/21/12    Review of Systems  A complete 10 system review of systems was obtained and all systems are negative except as noted in the HPI and PMH.    Allergies  Vancomycin and Fish allergy  Home Medications   Prior to Admission medications   Medication Sig Start Date End Date Taking? Authorizing Provider  ALPRAZolam Duanne Moron) 0.5 MG tablet Take 0.5 mg by mouth at bedtime as needed for anxiety or sleep.    Historical  Provider, MD  Aspirin-Acetaminophen-Caffeine (GOODY HEADACHE PO) Take 1 packet by mouth daily as needed (headache).    Historical Provider, MD  Daclatasvir Dihydrochloride 60 MG TABS Take 60 mg by mouth daily. 09/05/14   Thayer Headings, MD  oxyCODONE (ROXICODONE) 5 MG immediate release tablet Take 1-2 tabs every 6-8 hours for pain 08/31/13   Noland Fordyce, PA-C  penicillin v potassium (VEETID) 500 MG tablet Take 1 tablet (500 mg total) by mouth 4 (four) times daily. 11/29/13   Larene Pickett, PA-C  Sofosbuvir 400 MG TABS Take 400 mg by mouth daily. 09/05/14   Thayer Headings, MD  traMADol (ULTRAM) 50 MG tablet Take 1 tablet (50 mg total) by mouth every 6 (six) hours as needed. 11/29/13   Larene Pickett, PA-C  traMADol (ULTRAM) 50 MG tablet Take 1 tablet (50 mg total) by mouth every 8 (eight) hours as needed for moderate pain. 12/12/13   Lorayne Marek, MD  traMADol (ULTRAM) 50 MG tablet Take 1 tablet (50 mg total) by mouth every 8 (eight) hours as needed. 01/10/14   Lance Bosch, NP  traMADol (ULTRAM) 50 MG tablet Take 2 tablets (100 mg total) by mouth every 12 (twelve) hours as needed for moderate pain. 02/19/14   Lance Bosch, NP   Triage Vitals: BP 152/72 mmHg  Pulse  84  Temp(Src) 97.5 F (36.4 C) (Oral)  Resp 24  SpO2 98%  Physical Exam  Constitutional: He is oriented to person, place, and time. He appears well-developed and well-nourished.  HENT:  Head: Normocephalic and atraumatic.  Eyes: EOM are normal.  Neck: Normal range of motion.  Cardiovascular: Normal rate, regular rhythm, normal heart sounds and intact distal pulses.   Pulmonary/Chest: Effort normal and breath sounds normal. No respiratory distress.  Abdominal: Soft. He exhibits no distension. There is tenderness.  RUQ tenderness to palpation  Musculoskeletal: Normal range of motion.  Neurological: He is alert and oriented to person, place, and time.  Skin: Skin is warm and dry.  Psychiatric: He has a normal mood and affect.  Judgment normal.  Nursing note and vitals reviewed.   ED Course  Procedures (including critical care time)  DIAGNOSTIC STUDIES: Oxygen Saturation is 98% on RA, Normal by my interpretation.    COORDINATION OF CARE: 12:14 AM-Discussed treatment plan with pt at bedside and pt agreed to plan.     Labs Review Labs Reviewed  CBC - Abnormal; Notable for the following:    WBC 11.6 (*)    Platelets 112 (*)    All other components within normal limits  BASIC METABOLIC PANEL - Abnormal; Notable for the following:    Glucose, Bld 133 (*)    GFR calc non Af Amer 71 (*)    GFR calc Af Amer 83 (*)    All other components within normal limits  COMPREHENSIVE METABOLIC PANEL - Abnormal; Notable for the following:    Glucose, Bld 140 (*)    Total Protein 8.5 (*)    AST 112 (*)    ALT 106 (*)    Alkaline Phosphatase 160 (*)    Total Bilirubin 2.2 (*)    GFR calc non Af Amer 66 (*)    GFR calc Af Amer 76 (*)    All other components within normal limits  PROTIME-INR - Abnormal; Notable for the following:    Prothrombin Time 15.3 (*)    All other components within normal limits  I-STAT CG4 LACTIC ACID, ED - Abnormal; Notable for the following:    Lactic Acid, Venous 2.07 (*)    All other components within normal limits  LIPASE, BLOOD  ETHANOL  I-STAT TROPOININ, ED    Imaging Review Dg Chest 2 View  10/03/2014   CLINICAL DATA:  Mid chest pain for 1 day.  EXAM: CHEST  2 VIEW  COMPARISON:  01/25/2014  FINDINGS: The cardiomediastinal contours are normal. Mild coarsening of interstitial markings Pulmonary vasculature is normal. No consolidation, pleural effusion, or pneumothorax. No acute osseous abnormalities are seen.  IMPRESSION: No acute pulmonary process.   Electronically Signed   By: Jeb Levering M.D.   On: 10/03/2014 02:10     EKG Interpretation   Date/Time:  Wednesday October 02 2014 23:44:15 EDT Ventricular Rate:  73 PR Interval:  140 QRS Duration: 92 QT Interval:  386 QTC  Calculation: 425 R Axis:   57 Text Interpretation:  Sinus rhythm with marked sinus arrhythmia Possible  Left atrial enlargement Left ventricular hypertrophy with repolarization  abnormality Abnormal ECG Confirmed by OTTER  MD, OLGA ([id number]) on 10/02/2014  11:53:52 PM      MDM   Final diagnoses:  None   Patient presents emergency department for worsening pain around his liver. He states he has a history of cirrhosis and possibly cancer. His previous MRI shows definite cirrhosis we cannot exclude malignancy. Patient  reports taking chemotherapy oral tablets. We'll obtain laboratory studies, patient was given morphine and Zofran for relief. Also obtain chest x-ray to evaluate for any free air due to significant tenderness on exam.  XRAY and RUQ [place] is negative for cause of pain.  Patient given dilaudid for relief.  Labs show a leukocytosis and elevated bilirubin, however [place] is negative.  This may be simply worsening of liver cirrhosis.  He has good fu and advised to get an appt in 3 days for continued management.  Return precautions given.  His VS remain within his normal limits and he is safe for DC.  I personally performed the services described in this documentation, which was scribed in my presence. The recorded information has been reviewed and is accurate.   Everlene Balls, MD 10/03/14 [phone]

## 2014-10-03 NOTE — ED Notes (Addendum)
Patient continues to c/o severe upper abd pain.  Requesting something to drink.  Explained that he cannot have anything to eat or drink due to his abd pain  Dr Claudine Mouton aware of request for something for pain and to drink

## 2014-10-03 NOTE — Discharge Instructions (Signed)
Cirrhosis Rodney Reed,  Follow-up with your liver Dr. Within 3 days for continued care.  If symptoms worsen, come back to the ED immediately. Thank you. Cirrhosis is a condition of scarring of the liver which is caused when the liver has tried repairing itself following damage. This damage may come from a previous infection such as one of the forms of hepatitis (usually hepatitis C), or the damage may come from being injured by toxins. The main toxin that causes this damage is alcohol. The scarring of the liver from use of alcohol is irreversible. That means the liver cannot return to normal even though alcohol is not used any more. The main danger of hepatitis C infection is that it may cause long-lasting (chronic) liver disease, and this also may lead to cirrhosis. This complication is progressive and irreversible. CAUSES  Prior to available blood tests, hepatitis C could be contracted by blood transfusions. Since testing of blood has improved, this is now unlikely. This infection can also be contracted through intravenous drug use and the sharing of needles. It can also be contracted through sexual relationships. The injury caused by alcohol comes from too much use. It is not a few drinks that poison the liver, but years of misuse. Usually there will be some signs and symptoms early with scarring of the liver that suggest the development of better habits. Alcohol should never be used while using acetaminophen. A small dose of both taken together may cause irreversible damage to the liver. HOME CARE INSTRUCTIONS  There is no specific treatment for cirrhosis. However, there are things you can do to avoid making the condition worse.  Rest as needed.  Eat a well-balanced diet. Your caregiver can help you with suggestions.  Vitamin supplements including vitamins A, K, D, and thiamine can help.  A low-salt diet, water restriction, or diuretic medicine may be needed to reduce fluid retention.  Avoid  alcohol. This can be extremely toxic if combined with acetaminophen.  Avoid drugs which are toxic to the liver. Some of these include isoniazid, methyldopa, acetaminophen, anabolic steroids (muscle-building drugs), erythromycin, and oral contraceptives (birth control pills). Check with your caregiver to make sure medicines you are presently taking will not be harmful.  Periodic blood tests may be required. Follow your caregiver's advice regarding the timing of these.  Milk thistle is an herbal remedy which does protect the liver against toxins. However, it will not help once the liver has been scarred. SEEK MEDICAL CARE IF:  You have increasing fatigue or weakness.  You develop swelling of the hands, feet, legs, or face.  You vomit bright red blood, or a coffee ground appearing material.  You have blood in your stools, or the stools turn black and tarry.  You have a fever.  You develop loss of appetite, or have nausea and vomiting.  You develop jaundice.  You develop easy bruising or bleeding.  You have worsening of any of the problems you are concerned about. Document Released: 06/28/2005 Document Revised: 09/20/2011 Document Reviewed: 02/14/2008 [facility] Patient Information 2015 [place], [place]. This information is not intended to replace advice given to you by your health care provider. Make sure you discuss any questions you have with your health care provider.

## 2014-10-03 NOTE — ED Notes (Signed)
Patient requesting something for his dry mouth.  States if we give him something to drink he was spit it out  Explained we need to wait until all tests are done and resulted

## 2014-10-03 NOTE — ED Notes (Signed)
Patient presents with c/o severe abd pain starting approximately 3-4 hours PTA.  States it is "really bad".  +vomiting times 4  History of "liver issues"

## 2014-10-10 ENCOUNTER — Other Ambulatory Visit: Payer: Self-pay

## 2014-10-14 ENCOUNTER — Other Ambulatory Visit: Payer: Medicaid Other

## 2014-10-16 ENCOUNTER — Telehealth: Payer: Self-pay | Admitting: Pharmacist Clinician (PhC)/ Clinical Pharmacy Specialist
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
## 2014-10-17 ENCOUNTER — Other Ambulatory Visit: Payer: Self-pay

## 2014-10-28 ENCOUNTER — Ambulatory Visit: Payer: Medicaid Other | Admitting: Gastroenterology

## 2014-10-28 NOTE — Progress Notes (Signed)
Patient ID: Rodney Reed, male   DOB: 11/06/1965, 49 y.o.   MRN: [id number]   We noticed that you missed your appointment! Doctor Dr. Fuller Plan   Date 10/28/14 Time 10:15am  Your doctor has recommended that you not be rescheduled with our practice. He has review patient's records and states patient should be referred to [facility] by his Primary care physician for ongoing GI care.  Left a message for patient informing him of this and to  contact us at [phone] if he has any questions.

## 2014-11-05 ENCOUNTER — Encounter (HOSPITAL_COMMUNITY): Payer: Self-pay | Admitting: *Deleted

## 2014-11-05 ENCOUNTER — Emergency Department (HOSPITAL_COMMUNITY)
Admission: EM | Admit: 2014-11-05 | Discharge: 2014-11-05 | Disposition: A | Discharge status: Home / Self Care | Payer: Medicaid Other | Attending: Emergency Medicine | Admitting: Emergency Medicine

## 2014-11-05 ENCOUNTER — Emergency Department (HOSPITAL_COMMUNITY): Payer: Medicaid Other

## 2014-11-05 DIAGNOSIS — M545 Low back pain: Secondary | ICD-10-CM | POA: Insufficient documentation

## 2014-11-05 DIAGNOSIS — R202 Paresthesia of skin: Secondary | ICD-10-CM | POA: Insufficient documentation

## 2014-11-05 DIAGNOSIS — Z8589 Personal history of malignant neoplasm of other organs and systems: Secondary | ICD-10-CM | POA: Diagnosis not present

## 2014-11-05 DIAGNOSIS — Z720 Tobacco use: Secondary | ICD-10-CM | POA: Insufficient documentation

## 2014-11-05 DIAGNOSIS — Z8719 Personal history of other diseases of the digestive system: Secondary | ICD-10-CM | POA: Insufficient documentation

## 2014-11-05 DIAGNOSIS — M79604 Pain in right leg: Secondary | ICD-10-CM | POA: Insufficient documentation

## 2014-11-05 DIAGNOSIS — Z79899 Other long term (current) drug therapy: Secondary | ICD-10-CM | POA: Diagnosis not present

## 2014-11-05 DIAGNOSIS — M79605 Pain in left leg: Secondary | ICD-10-CM | POA: Diagnosis present

## 2014-11-05 MED ORDER — TRAMADOL HCL 50 MG PO TABS: 50.0000 mg | ORAL_TABLET | Freq: Four times a day (QID) | ORAL | Status: DC | PRN

## 2014-11-05 MED ORDER — OXYCODONE-ACETAMINOPHEN 5-325 MG PO TABS
1.0000 | ORAL_TABLET | Freq: Once | ORAL | Status: AC
Start: 2014-11-05 — End: 2014-11-05
  Administered 2014-11-05: 1 via ORAL
  Filled 2014-11-05: qty 1

## 2014-11-05 MED ORDER — FENTANYL CITRATE (PF) 100 MCG/2ML IJ SOLN
50.0000 ug | Freq: Once | INTRAMUSCULAR | Status: AC
  Administered 2014-11-05: 50 ug via INTRAMUSCULAR
  Filled 2014-11-05: qty 2

## 2014-11-05 MED ORDER — TRAMADOL HCL 50 MG PO TABS
50.0000 mg | ORAL_TABLET | Freq: Once | ORAL | Status: DC
  Filled 2014-11-05: qty 1

## 2014-11-05 MED ORDER — ACETAMINOPHEN 325 MG PO TABS
325.0000 mg | ORAL_TABLET | Freq: Once | ORAL | Status: DC
  Filled 2014-11-05: qty 1

## 2014-11-05 MED ORDER — FENTANYL CITRATE (PF) 100 MCG/2ML IJ SOLN: 100.0000 ug | Freq: Once | INTRAMUSCULAR | Status: DC

## 2014-11-05 NOTE — ED Provider Notes (Signed)
CSN: [id number]     Arrival date & time 11/05/14  1346 History   First MD Initiated Contact with Patient 11/05/14 1446     Chief Complaint  Patient presents with  . Leg Pain   (Consider location/radiation/quality/duration/timing/severity/associated sxs/prior Treatment) HPI  Rodney Reed is a 49 yo male presenting with report of lower back pain and tingling sensation in bilat legs.  He states he was rock climbing 5 days ago and fell hitting his head.  He was evaluated at a hospital in [facility].  He began having tingling in his legs 2 days ago.  He states he has chronic lumbar back pain but his pain seems worse in the last 2 days.  He did not have worsening lower back pain at the time of the fall but feels like he is just now noticing it because his head is hurting less.  He states the tingling in his legs is not in his hips or thighs but only from the knees down. He does not have any trouble walking. He denies any saddle paresthesias or bowel or bladder incontinence.     Past Medical History  Diagnosis Date  . Back pain   . Cancer   . Cirrhosis   . Pancreatitis    Past Surgical History  Procedure Laterality Date  . Tonsillectomy Bilateral as infant   Family History  Problem Relation Age of Onset  . Hypertension Mother   . Diabetes Mother    History  Substance Use Topics  . Smoking status: Current Every Day Smoker -- 0.50 packs/day for 33 years    Types: Cigarettes  . Smokeless tobacco: Not on file     Comment: trying to cut back  . Alcohol Use: 1.2 oz/week    2 Cans of beer per week     Comment: last use 11/21/12    Review of Systems  Constitutional: Negative for fever and chills.  HENT: Negative for sore throat.   Eyes: Negative for visual disturbance.  Respiratory: Negative for cough and shortness of breath.   Cardiovascular: Negative for chest pain and leg swelling.  Gastrointestinal: Negative for nausea, vomiting and diarrhea.  Genitourinary: Negative for  dysuria.  Musculoskeletal: Positive for back pain. Negative for myalgias.  Skin: Negative for rash.  Neurological: Positive for numbness. Negative for weakness and headaches.      Allergies  Vancomycin and Fish allergy  Home Medications   Prior to Admission medications   Medication Sig Start Date End Date Taking? Authorizing Provider  Aspirin-Acetaminophen-Caffeine (GOODY HEADACHE PO) Take 1 packet by mouth daily as needed (headache).    Historical Provider, MD  clonazePAM (KLONOPIN) 1 MG tablet Take 1 mg by mouth daily as needed for anxiety.    Historical Provider, MD  Daclatasvir Dihydrochloride 60 MG TABS Take 60 mg by mouth daily. 09/05/14   Thayer Headings, MD  HYDROcodone-acetaminophen (NORCO/VICODIN) 5-325 MG per tablet Take 1 tablet by mouth every 6 (six) hours as needed for moderate pain.    Historical Provider, MD  oxyCODONE (ROXICODONE) 5 MG immediate release tablet Take 1-2 tabs every 6-8 hours for pain Patient not taking: Reported on 10/03/2014 08/31/13   Noland Fordyce, PA-C  penicillin v potassium (VEETID) 500 MG tablet Take 1 tablet (500 mg total) by mouth 4 (four) times daily. Patient not taking: Reported on 10/03/2014 11/29/13   Larene Pickett, PA-C  Sofosbuvir 400 MG TABS Take 400 mg by mouth daily. 09/05/14   Thayer Headings, MD  traMADol Veatrice Bourbon)  50 MG tablet Take 1 tablet (50 mg total) by mouth every 6 (six) hours as needed. Patient not taking: Reported on 10/03/2014 11/29/13   Larene Pickett, PA-C  traMADol (ULTRAM) 50 MG tablet Take 1 tablet (50 mg total) by mouth every 8 (eight) hours as needed for moderate pain. Patient not taking: Reported on 10/03/2014 12/12/13   Lorayne Marek, MD  traMADol (ULTRAM) 50 MG tablet Take 1 tablet (50 mg total) by mouth every 8 (eight) hours as needed. Patient not taking: Reported on 10/03/2014 01/10/14   Lance Bosch, NP  traMADol (ULTRAM) 50 MG tablet Take 2 tablets (100 mg total) by mouth every 12 (twelve) hours as needed for moderate  pain. Patient not taking: Reported on 10/03/2014 02/19/14   Lance Bosch, NP   BP 133/68 mmHg  Pulse 84  Temp(Src) 98.2 F (36.8 C) (Oral)  Resp 20  SpO2 100% Physical Exam  Constitutional: He appears well-developed and well-nourished. No distress.  HENT:  Head: Normocephalic and atraumatic.  Eyes: Conjunctivae are normal.  Neck: Neck supple.  Cardiovascular: Normal rate, regular rhythm and intact distal pulses.   Pulmonary/Chest: Effort normal and breath sounds normal. No respiratory distress.  Abdominal: Soft. There is no tenderness.  Musculoskeletal: He exhibits tenderness.       Lumbar back: He exhibits tenderness and bony tenderness.       Back:  TTP across lower back, and L-spine, no deformity or crepitus noted.    Lymphadenopathy:    He has no cervical adenopathy.  Neurological: He is alert. He has normal strength. No cranial nerve deficit. GCS eye subscore is 4. GCS verbal subscore is 5. GCS motor subscore is 6.  2 pt discrimination norma in lower extremities, reports paresthesias in knees to all 5 toes. No redness, swelling ro deformity noted  Skin: Skin is warm and dry. No rash noted. He is not diaphoretic.  Psychiatric: He has a normal mood and affect.  Nursing note and vitals reviewed.   ED Course  Procedures (including critical care time) Labs Review Labs Reviewed - No data to display  Imaging Review No results found.   EKG Interpretation None      MDM   Final diagnoses:  None   49 yo male presenting with bilat paresthesias from his knees to his toes, but none in his hips or thighs. He has a history of bulging discs seen on MRI and a fall 5 days ago, however this current symptom did not begin at the time of the fall. Discussed case with Dr. Wallie Char. L-spine x-ray and Im pain meds ordered.   5:00 PM: At end of shift, hand-off report given to Quincy Carnes, PA-C. Plan includes follow up after x-ray and discharge home if negative with referral to  neurosurgery.   Filed Vitals:   11/05/14 1354 11/05/14 1619  BP: 133/68 135/72  Pulse: 84 66  Temp: 98.2 F (36.8 C)   TempSrc: Oral   Resp: 20 20  SpO2: 100% 100%   Meds given in ED:  Medications  fentaNYL (SUBLIMAZE) injection 50 mcg (50 mcg Intramuscular Given 11/05/14 1622)    New Prescriptions   No medications on file       Britt Bottom, NP 11/06/14 [place], MD 11/07/14 1728

## 2014-11-05 NOTE — ED Provider Notes (Signed)
Assumed care from PA Tysinger at shift change.  Patient with rock climbing accident earlier this month, evaluated at hospital in [facility] afterwards and discharged home.  He states he has paresthesias of bilateral LE from knee down to feet. No numbness or weakness.  No loss of bowel or bladder control.  Patient has known chronic back pain, states worse since fall.  Plan:  Lumbar spine films pending.  Will follow results and dispo.  Patient has been staffed with attending physician, Dr. Rogene Houston, thus far by PA Tysinger.  Dg Lumbar Spine Complete  11/05/2014   CLINICAL DATA:  49 year old male with lower lumbar spine pain and bilateral radiculopathy  EXAM: LUMBAR SPINE - COMPLETE 4+ VIEW  COMPARISON:  Prior lumbar spine radiographs 08/08/2013  FINDINGS: No evidence of acute fracture or malalignment. Focal degenerative disc disease at T12-L1 with prominent osteophyte formation. No significant interval progression compared to prior imaging. There is a suggestion of congenital central canal narrowing at L4 and L5, possibly due to short pedicles. This could be exaggerated by patient positioning. Normal bony mineralization. No lytic or blastic osseous lesion.  IMPRESSION: 1. No evidence of acute fracture or malalignment. 2. Degenerative disc disease. 3. 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 This appearance may be exaggerated by patient position. If further imaging is clinically warranted, MRI of the lumbar spine would be the test of choice for further evaluation.   Electronically Signed   By: Jacqulynn Cadet M.D.   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  There is no evidence of acute fx or subluxation. Patient remains without any focal neurologic deficits on exam-- no weakness/numbness, has subjective paresthesias only.  No loss of bowel/bladder control.  He requested additional meds while in the ED, i had ordered  tramadol and tylenol however patient refused this.  I went back into patient's room to discuss x-ray results and he is very upset and raising his voice.  He states tramadol will not work for him.  He was already given fentanyl as well which he states "didn't work".  I discussed with patient that there no significant acute findings on his x-ray today, and he should continue taking his home oxycodone that he was prescribed earlier in the month (10mg  #120 tabs) on 10/15/14.  Of note according to [place] controlled substance database, patient has received several narcotic scripts over the past 2 months from various providers and filled there in various cities including [place], [place], and [place].  Patient is to FU with his PCP.  Discussed plan with patient, he/she acknowledged understanding and agreed with plan of care.  Return precautions given for new or worsening symptoms.  Larene Pickett, PA-C 11/05/14 [place], MD 11/06/14 [phone]

## 2014-11-05 NOTE — ED Notes (Signed)
Pt states that he had a fall in a rock climbing accident earlier in the month. Pt no reports tingling in bilateral legs from the knee down. Pt reports know injury to back from the fall as well. Alert and oriented x3 (unsure of day of the week) triage.

## 2014-11-05 NOTE — Discharge Instructions (Signed)
Please follow the directions provided.  Be sure to follow-up with your primary care provider to ensure you are getting better.  You may use the neurosurgery referral for further mgmt of your back pain.  Take the tramadol as needed for pain.  Don't hesitate to return for any new, worsening or concerning symptoms.     SEEK IMMEDIATE MEDICAL CARE IF:  You feel weak.  You have trouble walking or moving.  You have problems with speech or vision.  You feel confused.  You cannot control your bladder or bowel movements.  You feel numbness after an injury.  You faint.  Your burning or prickling feeling gets worse when walking.  You have pain, cramps, or dizziness.  You develop a rash.

## 2014-11-08 ENCOUNTER — Other Ambulatory Visit: Payer: Medicaid Other

## 2014-11-08 DIAGNOSIS — K7460 Unspecified cirrhosis of liver: Secondary | ICD-10-CM

## 2014-11-08 DIAGNOSIS — B1920 Unspecified viral hepatitis C without hepatic coma: Secondary | ICD-10-CM

## 2014-11-08 LAB — CBC WITH DIFFERENTIAL/PLATELET
Basophils Absolute: 0 10*3/uL (ref 0.0–0.1)
Basophils Relative: 0 % (ref 0–1)
EOS ABS: 0.2 10*3/uL (ref 0.0–0.7)
Eosinophils Relative: 3 % (ref 0–5)
HEMATOCRIT: 37.3 % — AB (ref 39.0–52.0)
HEMOGLOBIN: 12.6 g/dL — AB (ref 13.0–17.0)
LYMPHS ABS: 1 10*3/uL (ref 0.7–4.0)
Lymphocytes Relative: 15 % (ref 12–46)
MCH: 29.4 pg (ref 26.0–34.0)
MCHC: 33.8 g/dL (ref 30.0–36.0)
MCV: 87.1 fL (ref 78.0–100.0)
MONOS PCT: 15 % — AB (ref 3–12)
MPV: 11.2 fL (ref 8.6–12.4)
Monocytes Absolute: 1 10*3/uL (ref 0.1–1.0)
Neutro Abs: 4.6 10*3/uL (ref 1.7–7.7)
Neutrophils Relative %: 67 % (ref 43–77)
Platelets: 96 10*3/uL — ABNORMAL LOW (ref 150–400)
RBC: 4.28 MIL/uL (ref 4.22–5.81)
RDW: 14.8 % (ref 11.5–15.5)
WBC: 6.8 10*3/uL (ref 4.0–10.5)

## 2014-11-08 LAB — COMPREHENSIVE METABOLIC PANEL
ALBUMIN: 3.3 g/dL — AB (ref 3.5–5.2)
ALT: 46 U/L (ref 0–53)
AST: 52 U/L — AB (ref 0–37)
Alkaline Phosphatase: 185 U/L — ABNORMAL HIGH (ref 39–117)
BILIRUBIN TOTAL: 1.6 mg/dL — AB (ref 0.2–1.2)
BUN: 9 mg/dL (ref 6–23)
CHLORIDE: 106 meq/L (ref 96–112)
CO2: 19 mEq/L (ref 19–32)
CREATININE: 1.08 mg/dL (ref 0.50–1.35)
Calcium: 8.9 mg/dL (ref 8.4–10.5)
Glucose, Bld: 144 mg/dL — ABNORMAL HIGH (ref 70–99)
Potassium: 4 mEq/L (ref 3.5–5.3)
SODIUM: 136 meq/L (ref 135–145)
TOTAL PROTEIN: 7.2 g/dL (ref 6.0–8.3)

## 2014-11-09 LAB — AFP TUMOR MARKER: AFP-Tumor Marker: 16.4 ng/mL — ABNORMAL HIGH (ref <6.1)

## 2014-11-11 LAB — HEPATITIS C RNA QUANTITATIVE
HCV Quantitative Log: <1.18 {Log} (ref <1.18)
HCV Quantitative: <15 IU/mL (ref <15)

## 2014-11-14 ENCOUNTER — Ambulatory Visit: Payer: Medicaid Other | Admitting: Internal Medicine

## 2014-12-12 ENCOUNTER — Ambulatory Visit (INDEPENDENT_AMBULATORY_CARE_PROVIDER_SITE_OTHER): Payer: Medicaid Other | Admitting: Internal Medicine

## 2014-12-12 ENCOUNTER — Encounter: Payer: Self-pay | Admitting: Internal Medicine

## 2014-12-12 VITALS — BP 138/83 | HR 73 | Temp 98.2°F | Ht 72.0 in | Wt 201.0 lb

## 2014-12-12 DIAGNOSIS — K7460 Unspecified cirrhosis of liver: Secondary | ICD-10-CM | POA: Diagnosis not present

## 2014-12-12 DIAGNOSIS — B182 Chronic viral hepatitis C: Secondary | ICD-10-CM | POA: Diagnosis present

## 2014-12-12 LAB — COMPLETE METABOLIC PANEL WITH GFR
ALBUMIN: 3.9 g/dL (ref 3.5–5.2)
ALK PHOS: 138 U/L — AB (ref 39–117)
ALT: 29 U/L (ref 0–53)
AST: 45 U/L — ABNORMAL HIGH (ref 0–37)
BUN: 12 mg/dL (ref 6–23)
CO2: 22 mEq/L (ref 19–32)
CREATININE: 1 mg/dL (ref 0.50–1.35)
Calcium: 8.8 mg/dL (ref 8.4–10.5)
Chloride: 106 mEq/L (ref 96–112)
GFR, Est African American: >89 mL/min
GFR, Est Non African American: 89 mL/min
Glucose, Bld: 85 mg/dL (ref 70–99)
Potassium: 3.9 mEq/L (ref 3.5–5.3)
Sodium: 139 mEq/L (ref 135–145)
Total Bilirubin: 0.9 mg/dL (ref 0.2–1.2)
Total Protein: 7.1 g/dL (ref 6.0–8.3)

## 2014-12-12 MED ORDER — RIBAVIRIN 200 MG PO TABS: ORAL_TABLET | ORAL | Status: DC
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
## 2014-12-12 NOTE — Assessment & Plan Note (Signed)
Unfortunately had a significant interruption of his medications but will have him continue with the 2 months or so of the combination medication. We will again try to get the 24 months of medication to continue it since he does have cirrhosis and does seem to have advanced disease with low platelets and albumin though his child Pugh score is only 5. I suspect this will be difficult and I therefore will add ribavirin at 600 mg daily to his regimen now to take for the duration of his hepatitis C treatment to for the added benefit of that. Return in one month.

## 2014-12-12 NOTE — Assessment & Plan Note (Signed)
He has obvious cirrhosis and concern with hepatocellular carcinoma with his MRI findings as well as his elevated AFP. He has been referred to [facility] gastroenterology unfortunately he has [place] access so we'll need to contact his primary physician to make the referral. At this time, his primary physician office which is an urgent care center is off for the week. Therefore we will send it once they are available. Question for GI would be first cirrhosis management, endoscopy. Also whether or not a biopsy for possible hepatocellular carcinoma would be indicated.

## 2014-12-13 LAB — CBC WITH DIFFERENTIAL/PLATELET
Basophils Absolute: 0 10*3/uL (ref 0.0–0.1)
Basophils Relative: 1 % (ref 0–1)
Eosinophils Absolute: 0.2 10*3/uL (ref 0.0–0.7)
Eosinophils Relative: 8 % — ABNORMAL HIGH (ref 0–5)
HEMATOCRIT: 37.8 % — AB (ref 39.0–52.0)
Hemoglobin: 12.8 g/dL — ABNORMAL LOW (ref 13.0–17.0)
LYMPHS ABS: 0.8 10*3/uL (ref 0.7–4.0)
Lymphocytes Relative: 29 % (ref 12–46)
MCH: 29.3 pg (ref 26.0–34.0)
MCHC: 33.9 g/dL (ref 30.0–36.0)
MCV: 86.5 fL (ref 78.0–100.0)
MONO ABS: 0.3 10*3/uL (ref 0.1–1.0)
MPV: 10.9 fL (ref 8.6–12.4)
Monocytes Relative: 10 % (ref 3–12)
Neutro Abs: 1.4 10*3/uL — ABNORMAL LOW (ref 1.7–7.7)
Neutrophils Relative %: 52 % (ref 43–77)
Platelets: 66 10*3/uL — ABNORMAL LOW (ref 150–400)
RBC: 4.37 MIL/uL (ref 4.22–5.81)
RDW: 15.1 % (ref 11.5–15.5)
WBC: 2.7 10*3/uL — ABNORMAL LOW (ref 4.0–10.5)

## 2014-12-13 LAB — HEPATITIS C RNA QUANTITATIVE
HCV Quantitative Log: 2.66 {Log} — ABNORMAL HIGH (ref <1.18)
HCV Quantitative: 458 IU/mL — ABNORMAL HIGH (ref <15)

## 2014-12-24 ENCOUNTER — Telehealth: Payer: Self-pay | Admitting: *Deleted

## 2014-12-24 NOTE — Telephone Encounter (Signed)
Called patient's PCP and referral was authorized for GI referral. Called [facility] and he was scheduled for an appt in April and no showed. They will reach out to patient and try to reschedule. Myrtis Hopping

## 2015-01-12 IMAGING — CR DG LUMBAR SPINE COMPLETE 4+V
5 series · 5 of 5 positions shown · non-contrast
Comparison: MRI of the lumbar spine performed 11/22/2012

CLINICAL DATA: Status post fall down stairs; lower back pain.

LUMBAR SPINE - COMPLETE 4+ VIEW

[t l-spine a.p.]
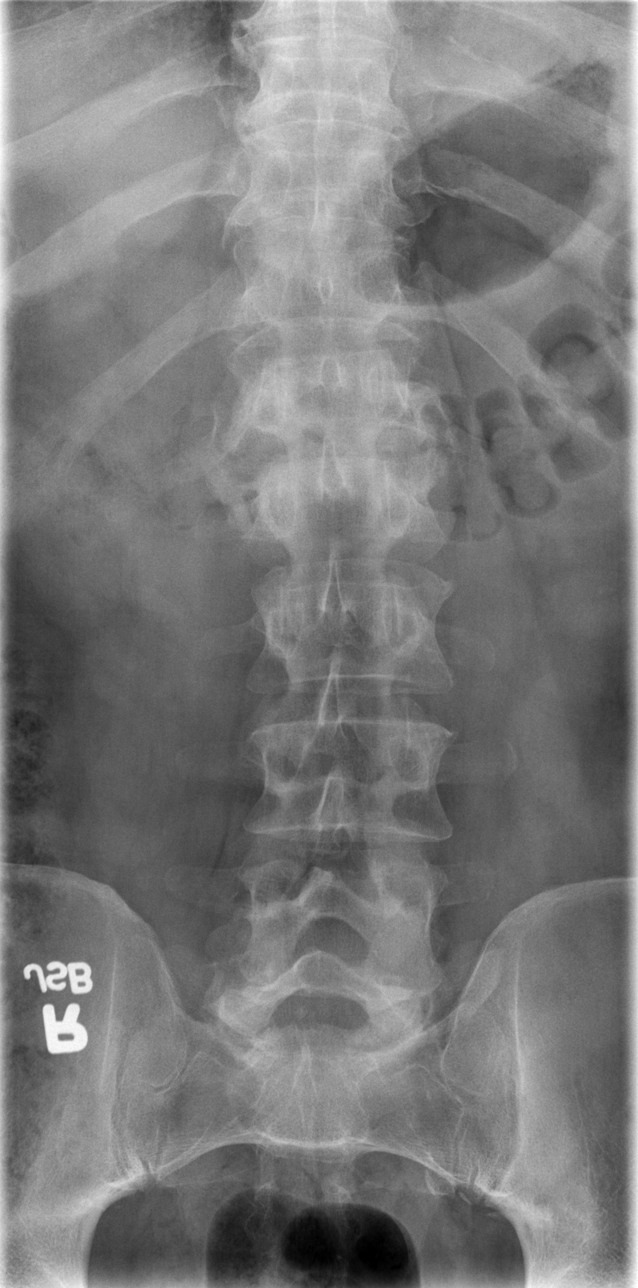

[t l-spine oblique exposure (1 of 2)]
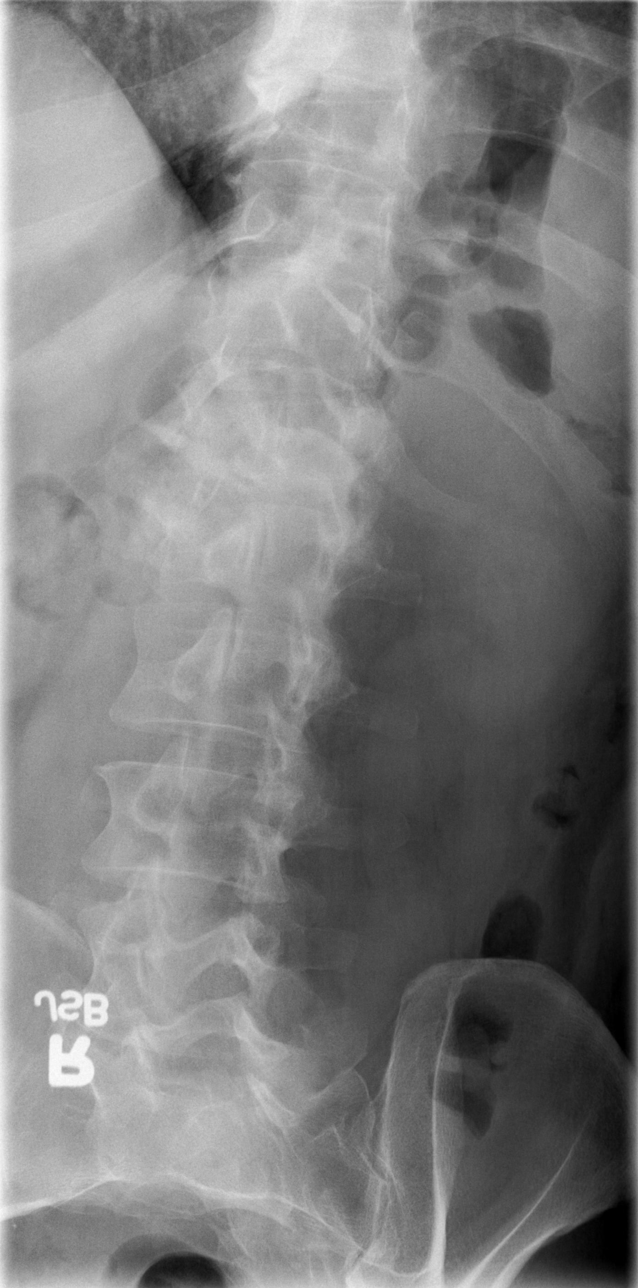

[t l-spine oblique exposure (2 of 2)]
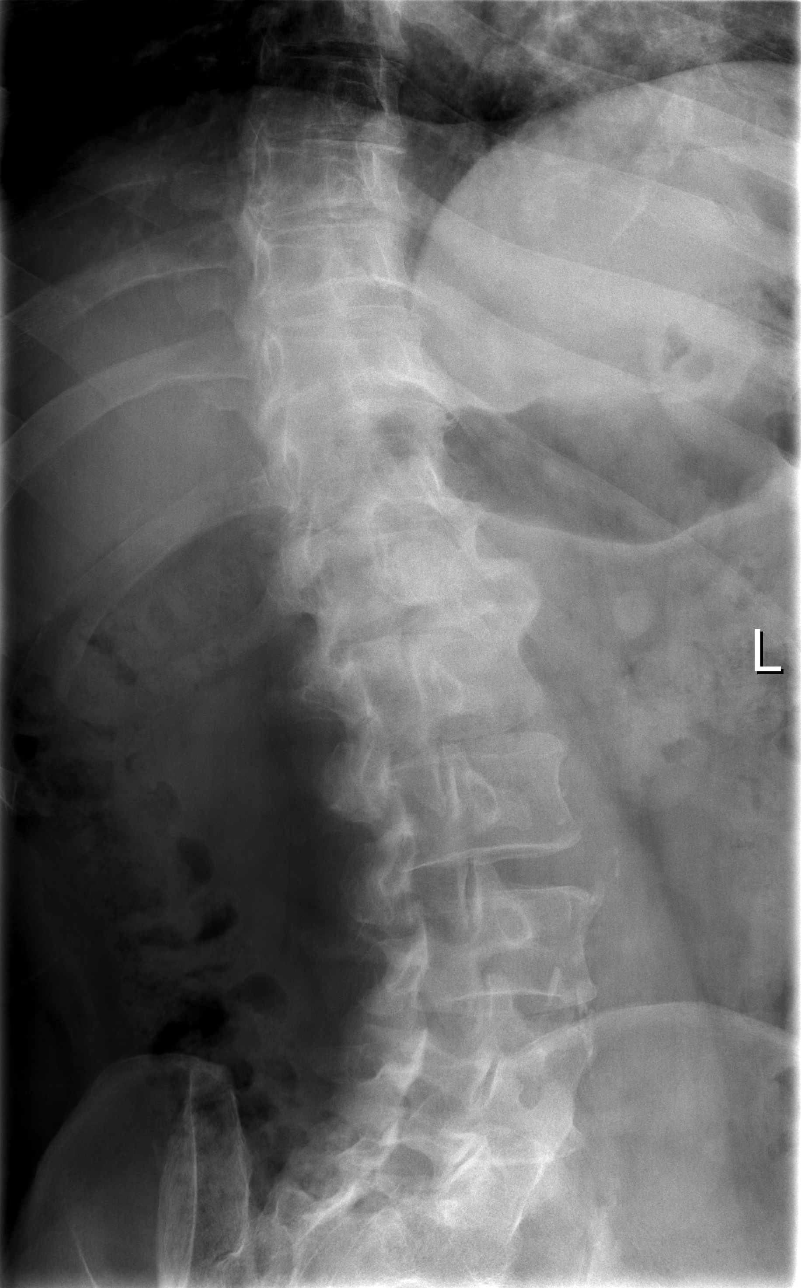

[t l-spine lat]
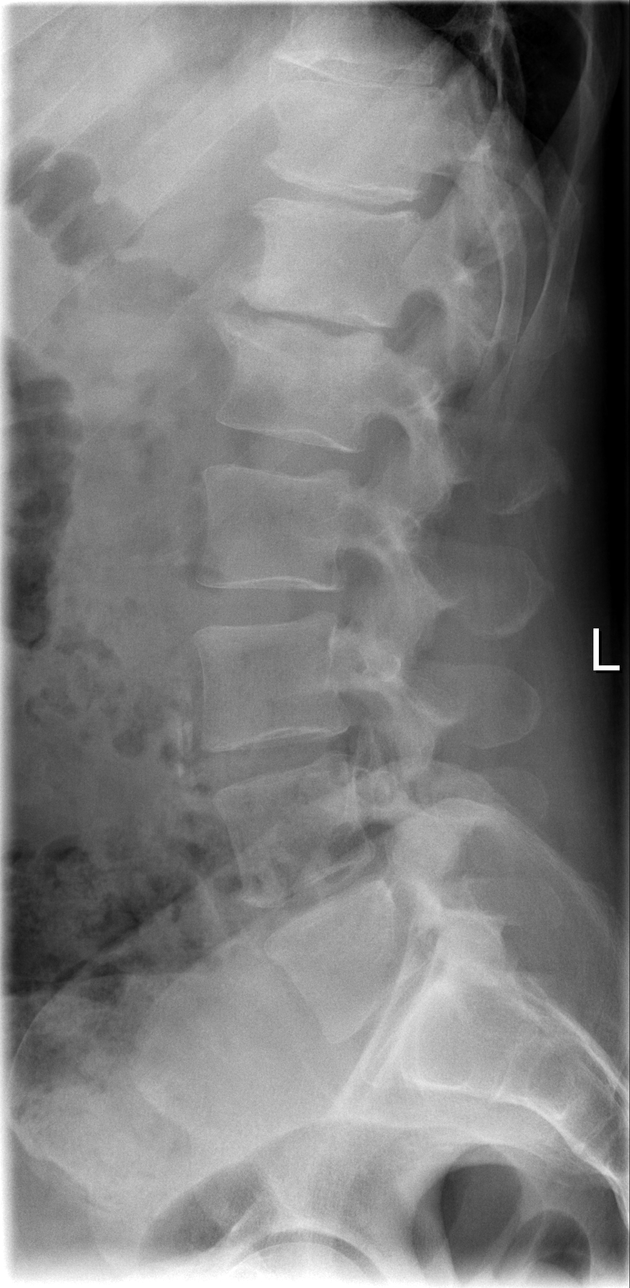

[t l-spine l5-s1 spot]
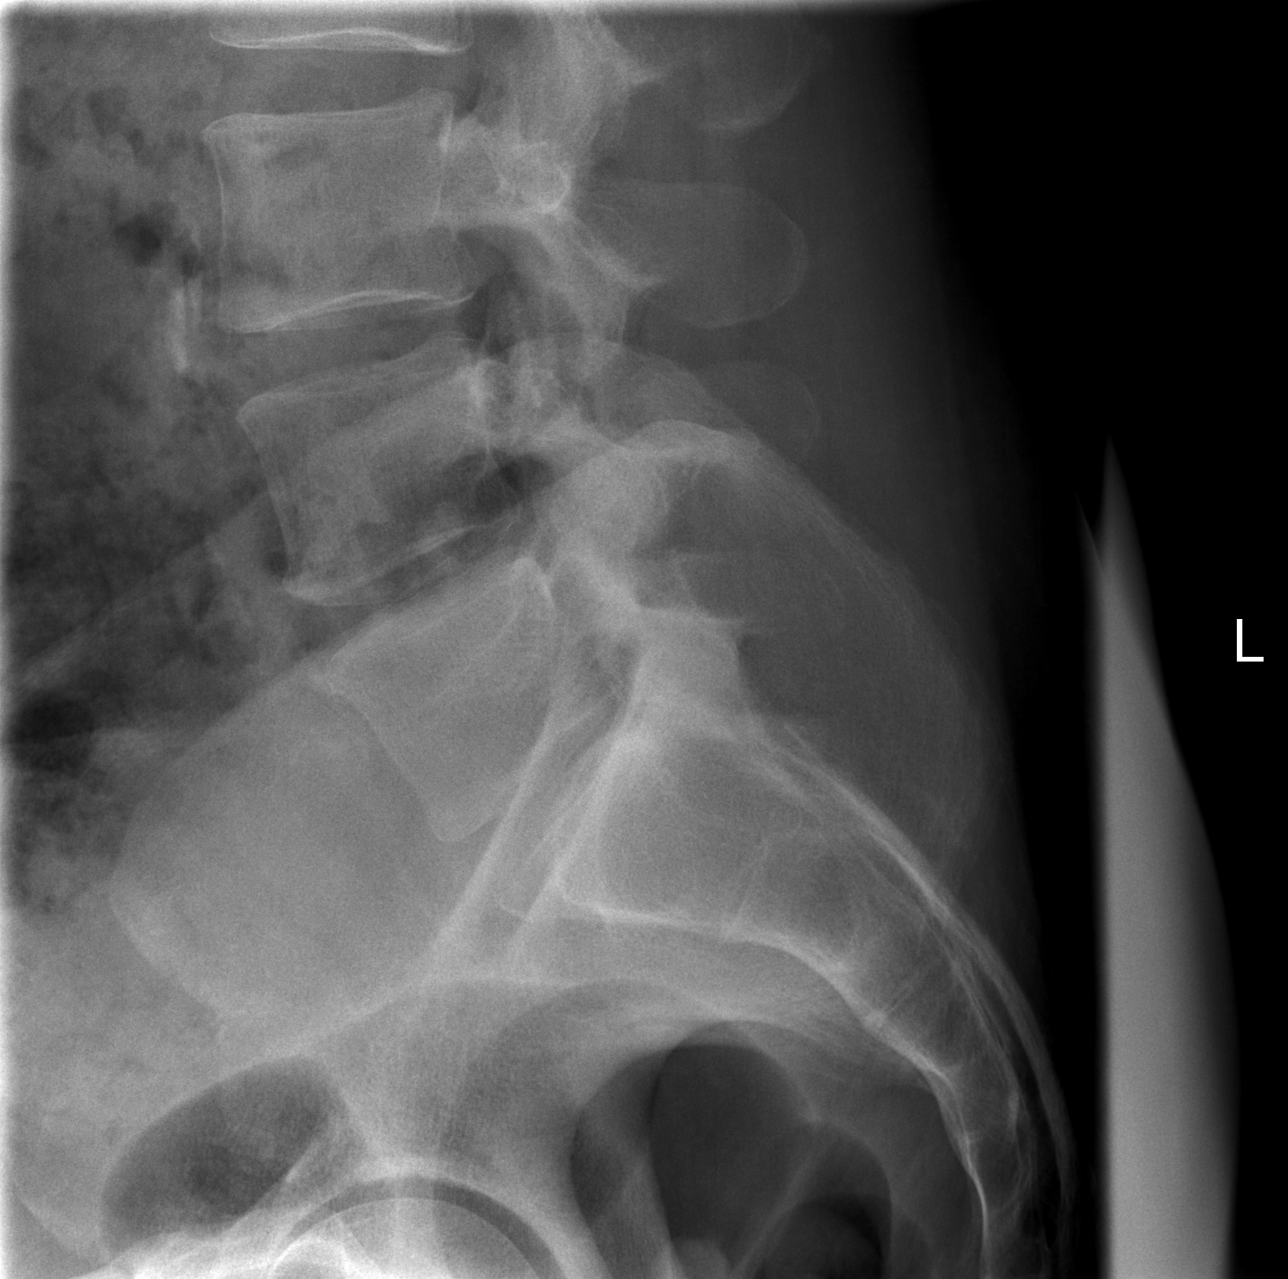

[5 of 5 positions shown; findings below may reference images not displayed]

FINDINGS: There is mild apparent new endplate irregularity
involving the vertebral endplates at T12-L1.  Would correlate for
any clinical evidence of mild discitis; alternatively, this could
reflect degenerative change.

There is no evidence of acute fracture or subluxation along the
lumbar spine.  Visualized vertebral spaces are otherwise preserved.
The visualized bowel gas pattern is grossly unremarkable.  The
sacroiliac joints are within normal limits.
IMPRESSION: 1.  No evidence of acute fracture or subluxation along the lumbar
spine.
2.  Mild apparent new endplate irregularity involving the vertebral
endplate at T12-L1.  Would correlate for any clinical evidence of
discitis; alternatively, this could reflect new degenerative
change.

## 2015-01-15 ENCOUNTER — Emergency Department (HOSPITAL_COMMUNITY)
Admission: EM | Admit: 2015-01-15 | Discharge: 2015-01-15 | Disposition: A | Discharge status: Home / Self Care | Payer: Medicaid Other | Attending: Emergency Medicine | Admitting: Emergency Medicine

## 2015-01-15 ENCOUNTER — Encounter (HOSPITAL_COMMUNITY): Payer: Self-pay

## 2015-01-15 DIAGNOSIS — Z79899 Other long term (current) drug therapy: Secondary | ICD-10-CM | POA: Diagnosis not present

## 2015-01-15 DIAGNOSIS — F4325 Adjustment disorder with mixed disturbance of emotions and conduct: Secondary | ICD-10-CM | POA: Diagnosis not present

## 2015-01-15 DIAGNOSIS — F200 Paranoid schizophrenia: Secondary | ICD-10-CM | POA: Insufficient documentation

## 2015-01-15 DIAGNOSIS — F1110 Opioid abuse, uncomplicated: Secondary | ICD-10-CM | POA: Diagnosis present

## 2015-01-15 DIAGNOSIS — F4329 Adjustment disorder with other symptoms: Secondary | ICD-10-CM | POA: Diagnosis present

## 2015-01-15 DIAGNOSIS — Z859 Personal history of malignant neoplasm, unspecified: Secondary | ICD-10-CM | POA: Diagnosis not present

## 2015-01-15 DIAGNOSIS — Z046 Encounter for general psychiatric examination, requested by authority: Secondary | ICD-10-CM | POA: Diagnosis present

## 2015-01-15 DIAGNOSIS — Z720 Tobacco use: Secondary | ICD-10-CM | POA: Insufficient documentation

## 2015-01-15 DIAGNOSIS — Z8719 Personal history of other diseases of the digestive system: Secondary | ICD-10-CM | POA: Diagnosis not present

## 2015-01-15 DIAGNOSIS — Z008 Encounter for other general examination: Secondary | ICD-10-CM

## 2015-01-15 HISTORY — DX: Paranoid schizophrenia: F20.0

## 2015-01-15 LAB — COMPREHENSIVE METABOLIC PANEL
ALT: 34 U/L (ref 17–63)
AST: 62 U/L — ABNORMAL HIGH (ref 15–41)
Albumin: 3.9 g/dL (ref 3.5–5.0)
Alkaline Phosphatase: 157 U/L — ABNORMAL HIGH (ref 38–126)
Anion gap: 8 (ref 5–15)
BUN: 12 mg/dL (ref 6–20)
CALCIUM: 9 mg/dL (ref 8.9–10.3)
CO2: 21 mmol/L — ABNORMAL LOW (ref 22–32)
CREATININE: 1.14 mg/dL (ref 0.61–1.24)
Chloride: 109 mmol/L (ref 101–111)
GFR calc non Af Amer: >60 mL/min (ref >60)
Glucose, Bld: 121 mg/dL — ABNORMAL HIGH (ref 65–99)
Potassium: 3.5 mmol/L (ref 3.5–5.1)
Sodium: 138 mmol/L (ref 135–145)
TOTAL PROTEIN: 8 g/dL (ref 6.5–8.1)
Total Bilirubin: 0.7 mg/dL (ref 0.3–1.2)

## 2015-01-15 LAB — CBC WITH DIFFERENTIAL/PLATELET
BASOS ABS: 0 10*3/uL (ref 0.0–0.1)
BASOS PCT: 1 % (ref 0–1)
Eosinophils Absolute: 0.2 10*3/uL (ref 0.0–0.7)
Eosinophils Relative: 6 % — ABNORMAL HIGH (ref 0–5)
HEMATOCRIT: 38.8 % — AB (ref 39.0–52.0)
Hemoglobin: 12.9 g/dL — ABNORMAL LOW (ref 13.0–17.0)
Lymphocytes Relative: 26 % (ref 12–46)
Lymphs Abs: 0.9 10*3/uL (ref 0.7–4.0)
MCH: 29.5 pg (ref 26.0–34.0)
MCHC: 33.2 g/dL (ref 30.0–36.0)
MCV: 88.6 fL (ref 78.0–100.0)
Monocytes Absolute: 0.3 10*3/uL (ref 0.1–1.0)
Monocytes Relative: 7 % (ref 3–12)
NEUTROS PCT: 60 % (ref 43–77)
Neutro Abs: 2.2 10*3/uL (ref 1.7–7.7)
PLATELETS: 91 10*3/uL — AB (ref 150–400)
RBC: 4.38 MIL/uL (ref 4.22–5.81)
RDW: 14.9 % (ref 11.5–15.5)
WBC: 3.6 10*3/uL — ABNORMAL LOW (ref 4.0–10.5)

## 2015-01-15 LAB — ETHANOL: Alcohol, Ethyl (B): 43 mg/dL — ABNORMAL HIGH (ref <5)

## 2015-01-15 LAB — ACETAMINOPHEN LEVEL: Acetaminophen (Tylenol), Serum: <10 ug/mL — ABNORMAL LOW (ref 10–30)

## 2015-01-15 LAB — SALICYLATE LEVEL

## 2015-01-15 MED ORDER — CLONAZEPAM 0.5 MG PO TABS
1.0000 mg | ORAL_TABLET | Freq: Every day | ORAL | Status: DC | PRN
  Administered 2015-01-15: 1 mg via ORAL
  Filled 2015-01-15: qty 2

## 2015-01-15 MED ORDER — DACLATASVIR DIHYDROCHLORIDE 60 MG PO TABS: 60.0000 mg | ORAL_TABLET | Freq: Every day | ORAL | Status: DC

## 2015-01-15 MED ORDER — OXYCODONE-ACETAMINOPHEN 5-325 MG PO TABS
1.0000 | ORAL_TABLET | Freq: Four times a day (QID) | ORAL | Status: DC | PRN
  Administered 2015-01-15 (×2): 1 via ORAL
  Filled 2015-01-15 (×2): qty 1

## 2015-01-15 MED ORDER — AMPHETAMINE-DEXTROAMPHET ER 10 MG PO CP24
30.0000 mg | ORAL_CAPSULE | Freq: Two times a day (BID) | ORAL | Status: DC | PRN
  Administered 2015-01-15 (×2): 30 mg via ORAL
  Filled 2015-01-15 (×2): qty 3

## 2015-01-15 MED ORDER — ONDANSETRON HCL 4 MG PO TABS: 4.0000 mg | ORAL_TABLET | Freq: Three times a day (TID) | ORAL | Status: DC | PRN

## 2015-01-15 MED ORDER — SOFOSBUVIR 400 MG PO TABS: 400.0000 mg | ORAL_TABLET | Freq: Every day | ORAL | Status: DC

## 2015-01-15 MED ORDER — ACETAMINOPHEN 325 MG PO TABS: 650.0000 mg | ORAL_TABLET | ORAL | Status: DC | PRN

## 2015-01-15 MED ORDER — IBUPROFEN 800 MG PO TABS: 800.0000 mg | ORAL_TABLET | Freq: Three times a day (TID) | ORAL | Status: DC | PRN

## 2015-01-15 MED ORDER — NICOTINE 21 MG/24HR TD PT24: 21.0000 mg | MEDICATED_PATCH | Freq: Every day | TRANSDERMAL | Status: DC

## 2015-01-15 MED ORDER — RIBAVIRIN 200 MG PO TABS: 600.0000 mg | ORAL_TABLET | Freq: Every morning | ORAL | Status: DC

## 2015-01-15 NOTE — BHH Suicide Risk Assessment (Signed)
Suicide Risk Assessment  Discharge Assessment   [facility] Discharge Suicide Risk Assessment   Demographic Factors:  Male and Caucasian  Total Time spent with patient: 45 minutes  Musculoskeletal: Strength & Muscle Tone: within normal limits Gait & Station: normal Patient leans: N/A  Psychiatric Specialty Exam: Physical Exam  Review of Systems  Constitutional: Negative.   HENT: Negative.   Eyes: Negative.   Respiratory: Negative.   Cardiovascular: Negative.   Gastrointestinal: Negative.   Genitourinary: Negative.   Musculoskeletal: Negative.   Skin: Negative.   Neurological: Negative.   Endo/Heme/Allergies: Negative.   Psychiatric/Behavioral: The patient is nervous/anxious.     Blood pressure 154/71, pulse 79, temperature 97.8 F (36.6 C), temperature source Oral, resp. rate 18, SpO2 100 %.There is no weight on file to calculate BMI.  General Appearance: Casual  Eye Contact::  Good  Speech:  Normal Rate  Volume:  Normal  Mood:  Euthymic  Affect:  Congruent  Thought Process:  Coherent  Orientation:  Full (Time, Place, and Person)  Thought Content:  WDL  Suicidal Thoughts:  No  Homicidal Thoughts:  No  Memory:  Immediate;   Good Recent;   Good Remote;   Good  Judgement:  Fair  Insight:  Fair  Psychomotor Activity:  Normal  Concentration:  Good  Recall:  Good  Fund of Knowledge:Good  Language: Good  Akathisia:  No  Handed:  Right  AIMS (if indicated):     Assets:  Housing Intimacy Leisure Time Physical Health Resilience Social Support  ADL's:  Intact  Cognition: WNL  Sleep:         Has this patient used any form of tobacco in the last 30 days? (Cigarettes, Smokeless Tobacco, Cigars, and/or Pipes) Yes, A prescription for an FDA-approved tobacco cessation medication was offered at discharge and the patient refused  Mental Status Per Nursing Assessment::   On Admission:    Anxiety  Current Mental Status by Physician: NA  Loss Factors: NA  Historical  Factors: NA  Risk Reduction Factors:   Sense of responsibility to family, Living with another person, especially a relative, Positive social support and Positive therapeutic relationship  Continued Clinical Symptoms:  None  Cognitive Features That Contribute To Risk:  None    Suicide Risk:  Minimal: No identifiable suicidal ideation.  Patients presenting with no risk factors but with morbid ruminations; may be classified as minimal risk based on the severity of the depressive symptoms  Principal Problem: Adjustment disorder with disturbance of emotion Discharge Diagnoses:  Patient Active Problem List   Diagnosis Date Noted  . Opiate abuse, continuous [F11.10] 01/15/2015    Priority: High  . Adjustment disorder with disturbance of emotion [F43.29] 01/15/2015    Priority: High  . Hepatic cirrhosis [K74.60] 09/05/2014  . Hepatitis C virus infection without hepatic coma [B19.20] 10/29/2013  . Chronic back pain [M54.9, G89.29] 10/22/2013    Follow-up Information    Schedule an appointment as soon as possible for a visit with [facility].   Why:  This Doctor is listed per [facility] access as your assigned doctor.  If this is not the preferred doctor Please call [place] DSS [phone]  If you plan to stay in [facility] call [place] [facility] [place] information:   pcp is Joneen Roach [address], [place], [place] [address] [phone] per [facility] access       Schedule an appointment as soon as possible for a visit with [facility],  VALERIE, NP.   Specialty:  Internal Medicine   Why:  You have been seen by Dr Feliciana Rossetti in 2015 You may also follow with her but she is not listed as your assigned primary care provider You may ask DSS to assist you to change Dr Joneen Roach to [facility] information:   [address] [place] [address] [phone]       Go to [place].   Specialty:  Professional Counselor    Why:  Go here for Behavioral health servies    Contact information:   [facility] [place] [address] [phone]       Plan Of Care/Follow-up recommendations:  Activity:  as tolerated Diet:  heart healthy diet  Is patient on multiple antipsychotic therapies at discharge:  No   Has Patient had three or more failed trials of antipsychotic monotherapy by history:  No  Recommended Plan for Multiple Antipsychotic Therapies: NA    Claudio Mondry, PMH-NP 01/15/2015, 2:50 PM

## 2015-01-15 NOTE — BH Assessment (Signed)
[facility] Assessment Progress Note  Per Donnelly Angelica, MD, this pt does not require psychiatric hospitalization at this time.  He is to be released from [facility] and discharged from [facility] with outpatient referrals.  As of this writing IVC has not been rescinded.  Pt's TTS assessment indicates that [facility] is his outpatient provider.  Discharge instructions advise him to follow up with them.  Pt's nurse will be notified once IVC is rescinded.  Jalene Mullet, [place] Triage Specialist [phone]
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
## 2015-01-15 NOTE — ED Notes (Signed)
Pt is under IVC by his mother, she states in the papers that he's not taking his meds and had a gun to kill himself. Pt denies all of this.

## 2015-01-15 NOTE — ED Notes (Signed)
Pt refuses to have his blood drawn.

## 2015-01-15 NOTE — BH Assessment (Signed)
Reviewed ED notes prior to initiating assessment. Per notes pt was petitioned for IVC by his mother who reported pt is not taking his medication and had a gun with intent to kill himself. Pt is denying all of this and stating he has issues with being confined and his mother knew this would make him have problems.    Assessment to commence shortly.    Lear Ng, [facility] Triage Specialist 01/15/2015 5:36 AM
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
## 2015-01-15 NOTE — BH Assessment (Signed)
Patient's mother called requesting information regarding patient's disposition. Writer informed mother that due to HIPPA no information could be given. Writer only able to inform patient's mother that her son/patient is still here at the hospital. Writer offered to have patient sign a release and then details of disposition could be discussed. Writer asked patient if he would sign a release to provide information to his mother. Patient declined stating, "I'm not signing nothing". Writer informed patient's mother that no information could be given without a release.

## 2015-01-15 NOTE — Discharge Instructions (Signed)
For your ongoing behavioral health needs you are advised to follow up with [facility] [place]:       [facility]      [place], [place] [address]      [phone]
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
## 2015-02-08 ENCOUNTER — Encounter (HOSPITAL_COMMUNITY): Payer: Self-pay | Admitting: *Deleted

## 2015-02-08 ENCOUNTER — Ambulatory Visit (HOSPITAL_COMMUNITY)
Admission: RE | Admit: 2015-02-08 | Discharge: 2015-02-08 | Disposition: A | Discharge status: Home / Self Care | Payer: Medicaid Other | Attending: Psychiatry | Admitting: Psychiatry

## 2015-02-08 ENCOUNTER — Emergency Department (HOSPITAL_COMMUNITY)
Admission: EM | Admit: 2015-02-08 | Discharge: 2015-02-10 | Disposition: A | Discharge status: Home / Self Care | Payer: Medicaid Other | Attending: Physician Assistant | Admitting: Physician Assistant

## 2015-02-08 DIAGNOSIS — Z720 Tobacco use: Secondary | ICD-10-CM | POA: Diagnosis not present

## 2015-02-08 DIAGNOSIS — F902 Attention-deficit hyperactivity disorder, combined type: Secondary | ICD-10-CM | POA: Diagnosis not present

## 2015-02-08 DIAGNOSIS — Z8739 Personal history of other diseases of the musculoskeletal system and connective tissue: Secondary | ICD-10-CM | POA: Insufficient documentation

## 2015-02-08 DIAGNOSIS — F1110 Opioid abuse, uncomplicated: Secondary | ICD-10-CM | POA: Diagnosis not present

## 2015-02-08 DIAGNOSIS — F200 Paranoid schizophrenia: Secondary | ICD-10-CM | POA: Insufficient documentation

## 2015-02-08 DIAGNOSIS — F909 Attention-deficit hyperactivity disorder, unspecified type: Secondary | ICD-10-CM | POA: Diagnosis not present

## 2015-02-08 DIAGNOSIS — F419 Anxiety disorder, unspecified: Secondary | ICD-10-CM | POA: Diagnosis not present

## 2015-02-08 DIAGNOSIS — F1310 Sedative, hypnotic or anxiolytic abuse, uncomplicated: Secondary | ICD-10-CM | POA: Insufficient documentation

## 2015-02-08 DIAGNOSIS — F1510 Other stimulant abuse, uncomplicated: Secondary | ICD-10-CM | POA: Diagnosis not present

## 2015-02-08 DIAGNOSIS — R45850 Homicidal ideations: Secondary | ICD-10-CM

## 2015-02-08 DIAGNOSIS — F1410 Cocaine abuse, uncomplicated: Secondary | ICD-10-CM | POA: Insufficient documentation

## 2015-02-08 DIAGNOSIS — Z8719 Personal history of other diseases of the digestive system: Secondary | ICD-10-CM | POA: Diagnosis not present

## 2015-02-08 DIAGNOSIS — F1920 Other psychoactive substance dependence, uncomplicated: Secondary | ICD-10-CM | POA: Insufficient documentation

## 2015-02-08 DIAGNOSIS — F29 Unspecified psychosis not due to a substance or known physiological condition: Secondary | ICD-10-CM | POA: Diagnosis not present

## 2015-02-08 DIAGNOSIS — Z008 Encounter for other general examination: Secondary | ICD-10-CM | POA: Diagnosis present

## 2015-02-08 DIAGNOSIS — Z859 Personal history of malignant neoplasm, unspecified: Secondary | ICD-10-CM | POA: Diagnosis not present

## 2015-02-08 DIAGNOSIS — Z79899 Other long term (current) drug therapy: Secondary | ICD-10-CM | POA: Diagnosis not present

## 2015-02-08 DIAGNOSIS — F1210 Cannabis abuse, uncomplicated: Secondary | ICD-10-CM | POA: Insufficient documentation

## 2015-02-08 DIAGNOSIS — F4325 Adjustment disorder with mixed disturbance of emotions and conduct: Secondary | ICD-10-CM | POA: Diagnosis not present

## 2015-02-08 DIAGNOSIS — F4329 Adjustment disorder with other symptoms: Secondary | ICD-10-CM | POA: Diagnosis present

## 2015-02-08 LAB — ACETAMINOPHEN LEVEL

## 2015-02-08 LAB — OCCULT BLOOD X 1 CARD TO LAB, STOOL: FECAL OCCULT BLD: NEGATIVE

## 2015-02-08 LAB — RAPID URINE DRUG SCREEN, HOSP PERFORMED
Amphetamines: POSITIVE — AB
Barbiturates: NOT DETECTED
Benzodiazepines: POSITIVE — AB
Cocaine: POSITIVE — AB
OPIATES: NOT DETECTED
TETRAHYDROCANNABINOL: NOT DETECTED

## 2015-02-08 LAB — COMPREHENSIVE METABOLIC PANEL
ALK PHOS: 147 U/L — AB (ref 38–126)
ALT: 44 U/L (ref 17–63)
ANION GAP: 8 (ref 5–15)
AST: 60 U/L — ABNORMAL HIGH (ref 15–41)
Albumin: 4 g/dL (ref 3.5–5.0)
BILIRUBIN TOTAL: 2.1 mg/dL — AB (ref 0.3–1.2)
BUN: 15 mg/dL (ref 6–20)
CO2: 21 mmol/L — AB (ref 22–32)
Calcium: 8.9 mg/dL (ref 8.9–10.3)
Chloride: 108 mmol/L (ref 101–111)
Creatinine, Ser: 1.04 mg/dL (ref 0.61–1.24)
GLUCOSE: 81 mg/dL (ref 65–99)
POTASSIUM: 3.4 mmol/L — AB (ref 3.5–5.1)
SODIUM: 137 mmol/L (ref 135–145)
Total Protein: 7.8 g/dL (ref 6.5–8.1)

## 2015-02-08 LAB — SALICYLATE LEVEL: Salicylate Lvl: <4 mg/dL (ref 2.8–30.0)

## 2015-02-08 LAB — CBC
HCT: 38.8 % — ABNORMAL LOW (ref 39.0–52.0)
Hemoglobin: 13.1 g/dL (ref 13.0–17.0)
MCH: 30 pg (ref 26.0–34.0)
MCHC: 33.8 g/dL (ref 30.0–36.0)
MCV: 89 fL (ref 78.0–100.0)
Platelets: 66 10*3/uL — ABNORMAL LOW (ref 150–400)
RBC: 4.36 MIL/uL (ref 4.22–5.81)
RDW: 15.3 % (ref 11.5–15.5)
WBC: 3.6 10*3/uL — AB (ref 4.0–10.5)

## 2015-02-08 LAB — ETHANOL: ALCOHOL ETHYL (B): 7 mg/dL — AB (ref <5)

## 2015-02-08 MED ORDER — CLONAZEPAM 1 MG PO TABS
1.0000 mg | ORAL_TABLET | Freq: Two times a day (BID) | ORAL | Status: DC
  Administered 2015-02-08 – 2015-02-09 (×4): 1 mg via ORAL
  Filled 2015-02-08 (×4): qty 1

## 2015-02-08 MED ORDER — OXYCODONE-ACETAMINOPHEN 5-325 MG PO TABS
1.0000 | ORAL_TABLET | Freq: Four times a day (QID) | ORAL | Status: DC | PRN
  Administered 2015-02-08 – 2015-02-09 (×4): 1 via ORAL
  Filled 2015-02-08 (×4): qty 1

## 2015-02-08 MED ORDER — QUETIAPINE FUMARATE 25 MG PO TABS
25.0000 mg | ORAL_TABLET | Freq: Three times a day (TID) | ORAL | Status: DC
  Filled 2015-02-08: qty 1

## 2015-02-08 MED ORDER — HYDROXYZINE HCL 25 MG PO TABS
50.0000 mg | ORAL_TABLET | Freq: Three times a day (TID) | ORAL | Status: DC
  Administered 2015-02-08 – 2015-02-09 (×4): 50 mg via ORAL
  Filled 2015-02-08 (×4): qty 2
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
## 2015-02-08 NOTE — ED Notes (Signed)
Bed: PI09 Expected date:  Expected time:  Means of arrival:  Comments: [facility] clearance

## 2015-02-08 NOTE — ED Notes (Signed)
reports increasing pain and is requesting pain medication

## 2015-02-08 NOTE — ED Notes (Signed)
Up to the bathroom 

## 2015-02-08 NOTE — ED Notes (Signed)
Pt. Noted sleeping in room. No complaints or concerns voiced. No distress or abnormal behavior noted. Will continue to monitor with security cameras. Q 15 minute rounds continue. 

## 2015-02-08 NOTE — ED Notes (Signed)
Report received from Janie Rambo RN. Pt. Sleeping, respirations regular and unlabored. Will continue to monitor for safety via security cameras and Q 15 minute checks. 
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
## 2015-02-08 NOTE — ED Notes (Signed)
Pt. Noted in rest room. No complaints or concerns voiced. No distress or abnormal behavior noted. Will continue to monitor with security cameras. Q 15 minute rounds continue.  
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
## 2015-02-08 NOTE — B[facility] Counselor (Signed)
Writer left voicemail for pt's mom, Lindell Spar [phone].   Arnold Long, [place] Therapeutic Triage Specialist

## 2015-02-08 NOTE — ED Notes (Signed)
Dr Kathrynn Ducking NP into see

## 2015-02-08 NOTE — ED Notes (Signed)
Sandwich and soft drink given.  

## 2015-02-08 NOTE — ED Notes (Signed)
Report called to [facility] - pt to go to room 38

## 2015-02-08 NOTE — ED Notes (Signed)
Soda and sandwich given.  Pt is aware that he needs to collect a urine sample

## 2015-02-08 NOTE — ED Notes (Signed)
Pt. Noted in room. No complaints or concerns voiced. No distress or abnormal behavior noted. Will continue to monitor with security cameras. Q 15 minute rounds continue. Sandwich and soft drink given. 
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
## 2015-02-09 DIAGNOSIS — F29 Unspecified psychosis not due to a substance or known physiological condition: Secondary | ICD-10-CM

## 2015-02-09 MED ORDER — ZIPRASIDONE MESYLATE 20 MG IM SOLR
20.0000 mg | Freq: Four times a day (QID) | INTRAMUSCULAR | Status: DC | PRN
  Administered 2015-02-09: 20 mg via INTRAMUSCULAR
  Filled 2015-02-09 (×2): qty 20

## 2015-02-09 MED ORDER — DIVALPROEX SODIUM 500 MG PO DR TAB
500.0000 mg | DELAYED_RELEASE_TABLET | Freq: Two times a day (BID) | ORAL | Status: DC
  Administered 2015-02-09: 500 mg via ORAL
  Filled 2015-02-09: qty 1

## 2015-02-09 MED ORDER — AMPHETAMINE-DEXTROAMPHET ER 5 MG PO CP24
15.0000 mg | ORAL_CAPSULE | Freq: Two times a day (BID) | ORAL | Status: DC
  Administered 2015-02-09 – 2015-02-10 (×2): 15 mg via ORAL
  Filled 2015-02-09 (×3): qty 1

## 2015-02-09 MED ORDER — DIPHENHYDRAMINE HCL 50 MG/ML IJ SOLN
50.0000 mg | Freq: Four times a day (QID) | INTRAMUSCULAR | Status: DC | PRN
  Administered 2015-02-09: 50 mg via INTRAMUSCULAR
  Filled 2015-02-09 (×2): qty 1

## 2015-02-09 MED ORDER — LORAZEPAM 2 MG/ML IJ SOLN
1.0000 mg | Freq: Four times a day (QID) | INTRAMUSCULAR | Status: DC | PRN
  Administered 2015-02-09: 1 mg via INTRAMUSCULAR
  Filled 2015-02-09 (×2): qty 1

## 2015-02-09 MED ORDER — CLONAZEPAM 1 MG PO TABS
1.0000 mg | ORAL_TABLET | Freq: Two times a day (BID) | ORAL | Status: DC
  Administered 2015-02-10: 1 mg via ORAL
  Filled 2015-02-09: qty 1

## 2015-02-09 NOTE — ED Notes (Signed)
Pt. Noted sleeping in room. No complaints or concerns voiced. No distress or abnormal behavior noted. Will continue to monitor with security cameras. Q 15 minute rounds continue. 

## 2015-02-09 NOTE — ED Notes (Signed)
Dr Lenna Sciara and Heloise Purpura aware of pt's demanding  Adderol.  Pt in room yelling, restless, rocking back and forth

## 2015-02-09 NOTE — ED Notes (Signed)
Up tot he bathroom to shower and change scrubs 

## 2015-02-09 NOTE — ED Notes (Signed)
Heloise Purpura NP into see

## 2015-02-09 NOTE — ED Notes (Signed)
Report received from [place]. Pt. Alert and oriented.  Pt. Upset about his medications and the IVC. Will continue to monitor for safety via security cameras and Q 15 minute checks.

## 2015-02-09 NOTE — Progress Notes (Signed)
This NP called [facility] ([place], [place]) to verify medications for pt. Pt is on Adderall 30mg  XR bid (only med not confirmed). All other meds currently active were also verified and all prescribed by Dr. Joneen Roach.  Benjamine Mola, [place] 02/09/15   05:59 PM

## 2015-02-09 NOTE — ED Notes (Signed)
Snack given, nad, waiting to talk to MD/NP

## 2015-02-09 NOTE — ED Notes (Signed)
Sitting quielty in his room, nad.

## 2015-02-09 NOTE — ED Notes (Signed)
Sitting quielty in room 

## 2015-02-09 NOTE — ED Notes (Addendum)
Pt in the bathroom yelling/cursing, still angry about not getting his adderol reports that his mind is racing and that the adderol helps. Dr Lenna Sciara and Heloise Purpura aware.  Pt back to his room sitting in chair, rocking, demanding that his mother bring his adderol up to the hospital so he can take it yelling at times.  Wanting to leave.
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
## 2015-02-09 NOTE — ED Notes (Signed)
Conrad NP in w/ pt

## 2015-02-09 NOTE — ED Notes (Addendum)
Pt up to the desk angry, loud,  reporting that the adderol is not helping because of the lower dose and is demanding  his regular dose.

## 2015-02-09 NOTE — ED Notes (Signed)
Pt. At nurses station yelling and threatening to "go off". Pt. Yelling that he was promised all his at home medications and that he was lied to. Pt. Cussing repeatedly and upsetting the milieu to the point of upsetting other patients.

## 2015-02-09 NOTE — ED Notes (Signed)
Up to the bathroom, nad, waiting for MD to eval, pleasant, cooperative.

## 2015-02-09 NOTE — ED Notes (Addendum)
Pt up to the desk reporting that he has a adderol that he hid yesterday when he came in. "cause I knew you were going to do this."  Pt was rolling something in his fingers, put his hand to his mouth, but staff did not see a pill.  Pt then returned to his room.  When questioned  reported that he had taken an adderol, and did not have any other medication.  Will notify Dr J/conrad

## 2015-02-09 NOTE — ED Notes (Addendum)
Pt sitting in room angry/yelling that he has not been seen and that he has been "lied too" about being able to being given his adderol and is wanting to leave.  Pt reports that he needs his adderol to focus because of his ADD, and that he is trying to stay calm and sts that he will "flip" if he is not given his adderol. Pt is aware that he can not leave at this time.  Support given, assurred that the NP will see him. Pt medicated.

## 2015-02-09 NOTE — ED Notes (Addendum)
Pt up to the desk calm, cooperative. Pt denies that he has any other medication, back to his room, watching tv ,nad.
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
## 2015-02-09 NOTE — ED Notes (Addendum)
Angry, yelling, demanding his adderol sitting in his room.  Medicated for back pain.  NP aware

## 2015-02-10 DIAGNOSIS — F4329 Adjustment disorder with other symptoms: Secondary | ICD-10-CM

## 2015-02-10 NOTE — ED Notes (Signed)
Pt. Noted sleeping in room. No complaints or concerns voiced. No distress or abnormal behavior noted. Will continue to monitor with security cameras. Q 15 minute rounds continue. 

## 2015-02-10 NOTE — ED Notes (Signed)
Pt is angry because the MD D/C percocet.  Dr. Loni Muse to discharge pt.

## 2015-02-10 NOTE — Consult Note (Signed)
[facility]   Reason for Consult:  Substance Abuse Referring Physician:  EDP Patient Identification: Rodney Reed MRN:  [id number] Principal Diagnosis: Adjustment disorder with disturbance of emotion Diagnosis:   Patient Active Problem List   Diagnosis Date Noted  . Anxiety [F41.9] 02/08/2015    Priority: High  . ADHD (attention deficit hyperactivity disorder), combined type [F90.2] 02/08/2015    Priority: High  . Opiate abuse, continuous [F11.10] 01/15/2015    Priority: High  . Adjustment disorder with disturbance of emotion [F43.29] 01/15/2015    Priority: High  . Hepatic cirrhosis [K74.60] 09/05/2014  . Hepatitis C virus infection without hepatic coma [B19.20] 10/29/2013  . Chronic back pain [M54.9, G89.29] 10/22/2013    Total Time spent with patient: 30 minutes  Subjective:   Rodney Reed is a 49 y.o. male patient does not want treatment without opiates, Adderall, and benzodiazepines.  HPI:  The patient became belligerent today when his opiates, Adderall, and Klonopin got discontinued.  He started cursing, threatening, demanding, yelling, and not wanting to stay without these medications.  Security was called and he was escorted off the unit per the psychiatrist's request.  His mother was concerned and this provider talked with her along with the case manager later about substance abuse resources for her son.  She reported he has been "banned" from [facility] and is not allowed back there.   HPI Elements:  Generalized, chronic, daily, years, stressors   Past Medical History:  Past Medical History  Diagnosis Date  . Back pain   . Cancer   . Cirrhosis   . Pancreatitis   . Schizophrenia, paranoid     Past Surgical History  Procedure Laterality Date  . Tonsillectomy Bilateral as infant   Family History:  Family History  Problem Relation Age of Onset  . Hypertension Mother   . Diabetes Mother    Social History:  History  Alcohol Use  .  1.2 oz/week  . 2 Cans of beer per week    Comment: last use 11/21/12     History  Drug Use No    Comment:  hx of heroin use; sts former use    History   Social History  . Marital Status: Legally Separated    Spouse Name: N/A  . Number of Children: N/A  . Years of Education: N/A   Social History Main Topics  . Smoking status: Current Every Day Smoker -- 0.50 packs/day for 33 years    Types: Cigarettes  . Smokeless tobacco: Not on file     Comment: trying to cut back  . Alcohol Use: 1.2 oz/week    2 Cans of beer per week     Comment: last use 11/21/12  . Drug Use: No     Comment:  hx of heroin use; sts former use  . Sexual Activity: Not on file   Other Topics Concern  . None   Social History Narrative   Additional Social History:                          Allergies:   Allergies  Allergen Reactions  . Vancomycin Anaphylaxis  . Fish Allergy Other (See Comments)    Hot flash, dizziness    Labs: No results found for this or any previous visit (from the past 48 hour(s)).  Vitals: Blood pressure 118/77, pulse 72, temperature 98.4 F (36.9 C), temperature source Oral, resp. rate 18, SpO2 100 %.  Risk  to Self: Is patient at risk for suicide?: No, but patient needs Medical Clearance Risk to Others:   Prior Inpatient Therapy:   Prior Outpatient Therapy:    No current facility-administered medications for this encounter.   Current Outpatient Prescriptions  Medication Sig Dispense Refill  . ADDERALL XR 30 MG 24 hr capsule Take 30 mg by mouth 2 (two) times daily as needed.  0  . clonazePAM (KLONOPIN) 1 MG tablet Take 1 mg by mouth 2 (two) times daily.     Marland Kitchen oxyCODONE-acetaminophen (PERCOCET/ROXICET) 5-325 MG per tablet Take 2 tablets by mouth every 6 (six) hours as needed. pain  0  . Daclatasvir Dihydrochloride 60 MG TABS Take 60 mg by mouth daily. (Patient not taking: Reported on 02/08/2015) 28 tablet 2  . oxyCODONE (ROXICODONE) 5 MG immediate release tablet Take  1-2 tabs every 6-8 hours for pain (Patient not taking: Reported on 10/03/2014) 10 tablet 0  . penicillin v potassium (VEETID) 500 MG tablet Take 1 tablet (500 mg total) by mouth 4 (four) times daily. (Patient not taking: Reported on 10/03/2014) 40 tablet 0  . ribavirin (COPEGUS) 200 MG tablet 600 mg in am (Patient not taking: Reported on 02/08/2015) 84 tablet 2  . Sofosbuvir 400 MG TABS Take 400 mg by mouth daily. (Patient not taking: Reported on 02/08/2015) 28 tablet 2  . traMADol (ULTRAM) 50 MG tablet Take 1 tablet (50 mg total) by mouth every 6 (six) hours as needed. (Patient not taking: Reported on 12/12/2014) 15 tablet 0    Musculoskeletal: Strength & Muscle Tone: within normal limits Gait & Station: normal Patient leans: N/A  Psychiatric Specialty Exam: Physical Exam  Review of Systems  Constitutional: Negative.   HENT: Negative.   Eyes: Negative.   Respiratory: Negative.   Cardiovascular: Negative.   Gastrointestinal: Negative.   Genitourinary: Negative.   Musculoskeletal: Negative.   Skin: Negative.   Neurological: Negative.   Endo/Heme/Allergies: Negative.   Psychiatric/Behavioral: Positive for substance abuse.    Blood pressure 118/77, pulse 72, temperature 98.4 F (36.9 C), temperature source Oral, resp. rate 18, SpO2 100 %.There is no weight on file to calculate BMI.  General Appearance: Casual  Eye Contact::  Good  Speech:  Normal Rate  Volume:  Increased  Mood:  Angry  Affect:  Congruent  Thought Process:  Coherent  Orientation:  Full (Time, Place, and Person)  Thought Content:  WDL  Suicidal Thoughts:  No  Homicidal Thoughts:  No  Memory:  Immediate;   Good Recent;   Fair Remote;   Good  Judgement:  Fair  Insight:  Lacking  Psychomotor Activity:  Normal  Concentration:  Good  Recall:  Good  Fund of Knowledge:Good  Language: Good  Akathisia:  No  Handed:  Right  AIMS (if indicated):     Assets:  Housing Leisure Time Physical Health Resilience Social  Support  ADL's:  Intact  Cognition: WNL  Sleep:      Medical Decision Making: Review of Psycho-Social Stressors (1) and Review of New Medication or Change in Dosage (2)  Treatment Plan Summary: Daily contact with patient to assess and evaluate symptoms and progress in treatment, Medication management and Plan :  Adjustment disorder with disturbance of emotion:  Klonopin 1 mg BID given during hospitalization for anxiety, Depakote for mood stability (Bipolar affective disorder) Substance Abuse:  Resources provided with consultation by the Case Manager and counseling by the NP for the mother after patient signed consent  Plan:  No evidence of imminent  risk to self or others at present.   Disposition: discharge with substance abuse resources.  Waylan Boga, [place] 02/10/2015 1:30 PM Patient seen face-to-face for psychiatric evaluation, chart reviewed and case discussed with the physician extender and developed treatment plan. Reviewed the information documented and agree with the treatment plan. Corena Pilgrim, MD

## 2015-02-10 NOTE — ED Notes (Signed)
Pt discharged after becoming angry because he wasn't getting the meds he wanted.  He left safely escorted by [employer] and security.

## 2015-02-10 NOTE — Progress Notes (Signed)
ED CM and TTS staff spoke with pt's mother about resources available for detox in [facility] and provided a list of [employer] provider. TTS provided a list of local detox center. Mother states pt has been living with her in [place] for the last 2 years.  Pt's mother informed Cm and TTS staff that pt was receiving "[facility]com[facility]" from [facility]" "that caused him to flat line" and "after he was able to come around he left against medical advice"  Pt's mother states this is "why [facility] will not see him again"  Cm discussed generally hospitals will not turn patients away for services needed  CM encouraged pt to be seen by pcp, gatekeeper of his medical care for guidance with pain management for pt. Encouraged to change to a [employer] provider vs Dr Joneen Roach [address], [place], [place] [address] [phone] per Myna Bright access response hx His mother states pcp has not been able to get pt in any pain management center and reports [facility]" CM discussed mother to get permission from pt to speak with his pcp and discussed that pain management center have criteria and regulations that must be followed to be a participant. CM noted throughout the interaction with pt's mother she was constantly moving her right leg up and down.  Mother given the contact information for the local [facility] near [phone] as [place], [employer] ID [id number] Type [facility] Location Name [place] [facility] Tuesday Time 8:00pm Language Notes Beginner Meeting from 7 to 7:45PM Encouraged mother to contact [facility] to gain insight for her and pt

## 2015-02-10 NOTE — [facility] Suicide Risk Assessment (Signed)
Suicide Risk Assessment  Discharge Assessment   [facility] Discharge Suicide Risk Assessment   Demographic Factors:  Male and Caucasian  Total Time spent with patient: 30 minutes  Musculoskeletal: Strength & Muscle Tone: within normal limits Gait & Station: normal Patient leans: N/A  Psychiatric Specialty Exam: Physical Exam  Review of Systems  Constitutional: Negative.   HENT: Negative.   Eyes: Negative.   Respiratory: Negative.   Cardiovascular: Negative.   Gastrointestinal: Negative.   Genitourinary: Negative.   Musculoskeletal: Negative.   Skin: Negative.   Neurological: Negative.   Endo/Heme/Allergies: Negative.   Psychiatric/Behavioral: Positive for substance abuse.    Blood pressure 118/77, pulse 72, temperature 98.4 F (36.9 C), temperature source Oral, resp. rate 18, SpO2 100 %.There is no weight on file to calculate BMI.  General Appearance: Casual  Eye Contact::  Good  Speech:  Normal Rate  Volume:  Increased  Mood:  Angry  Affect:  Congruent  Thought Process:  Coherent  Orientation:  Full (Time, Place, and Person)  Thought Content:  WDL  Suicidal Thoughts:  No  Homicidal Thoughts:  No  Memory:  Immediate;   Good Recent;   Fair Remote;   Good  Judgement:  Fair  Insight:  Lacking  Psychomotor Activity:  Normal  Concentration:  Good  Recall:  Good  Fund of Knowledge:Good  Language: Good  Akathisia:  No  Handed:  Right  AIMS (if indicated):     Assets:  Housing Leisure Time Physical Health Resilience Social Support  ADL's:  Intact  Cognition: WNL  Sleep:         Has this patient used any form of tobacco in the last 30 days? (Cigarettes, Smokeless Tobacco, Cigars, and/or Pipes) Yes, A prescription for an FDA-approved tobacco cessation medication was offered at discharge and the patient refused  Mental Status Per Nursing Assessment::   On Admission:   Homicidal ideations  Current Mental Status by Physician: NA  Loss Factors: NA  Historical  Factors: NA  Risk Reduction Factors:   Sense of responsibility to family, Living with another person, especially a relative and Positive social support  Continued Clinical Symptoms:  Substance abuse   Cognitive Features That Contribute To Risk:  None    Suicide Risk:  Minimal: No identifiable suicidal ideation.  Patients presenting with no risk factors but with morbid ruminations; may be classified as minimal risk based on the severity of the depressive symptoms  Principal Problem: Adjustment disorder with disturbance of emotion Discharge Diagnoses:  Patient Active Problem List   Diagnosis Date Noted  . Anxiety [F41.9] 02/08/2015    Priority: High  . ADHD (attention deficit hyperactivity disorder), combined type [F90.2] 02/08/2015    Priority: High  . Opiate abuse, continuous [F11.10] 01/15/2015    Priority: High  . Adjustment disorder with disturbance of emotion [F43.29] 01/15/2015    Priority: High  . Hepatic cirrhosis [K74.60] 09/05/2014  . Hepatitis C virus infection without hepatic coma [B19.20] 10/29/2013  . Chronic back pain [M54.9, G89.29] 10/22/2013      Plan Of Care/Follow-up recommendations:  Activity:  as tolerated Diet:  heart healthy diet  Is patient on multiple antipsychotic therapies at discharge:  No   Has Patient had three or more failed trials of antipsychotic monotherapy by history:  No  Recommended Plan for Multiple Antipsychotic Therapies: NA    Evelen Vazguez, PMH-NP 02/10/2015, 3:33 PM

## 2015-02-10 NOTE — BH Assessment (Signed)
[place] Assessment Progress Note  At the request of Waylan Boga, NP, this pt's mother is at [facility] requesting referral information for pt's substance abuse problems.  Theodoro Clock reports that pt has granted consent to release information to her.  I provided her with written information for [facility], [facility], and [facility].  Jalene Mullet, [place] Triage Specialist [phone]

## 2015-03-04 ENCOUNTER — Telehealth: Payer: Self-pay | Admitting: *Deleted

## 2015-03-04 NOTE — Telephone Encounter (Signed)
Patient has been denied at [facility] per chart review.  Will forward to [place] for referral to [facility] (per suggestion in phone note from [place])

## 2015-03-04 NOTE — Telephone Encounter (Signed)
Patient left message in triage asking about his appointment for a liver biopsy.  Left a new number for call back, documented in this phone note. Landis Gandy, RN

## 2015-03-04 NOTE — Telephone Encounter (Signed)
Patient should call [facility] to reschedule; he no showed once in April. Myrtis Hopping

## 2015-03-07 NOTE — Telephone Encounter (Signed)
FYI

## 2015-03-12 ENCOUNTER — Emergency Department (HOSPITAL_COMMUNITY)
Admission: EM | Admit: 2015-03-12 | Discharge: 2015-03-12 | Disposition: A | Discharge status: Other Acute Inpatient Hospital | Payer: Medicaid Other | Attending: Emergency Medicine | Admitting: Emergency Medicine

## 2015-03-12 ENCOUNTER — Encounter (HOSPITAL_COMMUNITY): Payer: Self-pay

## 2015-03-12 ENCOUNTER — Inpatient Hospital Stay (HOSPITAL_COMMUNITY)
Admission: AD | Admit: 2015-03-12 | Discharge: 2015-03-14 | DRG: 885 | Disposition: A | Discharge status: Home / Self Care | Payer: Medicaid Other | Source: Intra-hospital | Attending: Psychiatry | Admitting: Psychiatry

## 2015-03-12 ENCOUNTER — Encounter (HOSPITAL_COMMUNITY): Payer: Self-pay | Admitting: *Deleted

## 2015-03-12 DIAGNOSIS — Z720 Tobacco use: Secondary | ICD-10-CM | POA: Insufficient documentation

## 2015-03-12 DIAGNOSIS — Z79899 Other long term (current) drug therapy: Secondary | ICD-10-CM | POA: Diagnosis not present

## 2015-03-12 DIAGNOSIS — F419 Anxiety disorder, unspecified: Secondary | ICD-10-CM | POA: Insufficient documentation

## 2015-03-12 DIAGNOSIS — F1599 Other stimulant use, unspecified with unspecified stimulant-induced disorder: Secondary | ICD-10-CM | POA: Diagnosis not present

## 2015-03-12 DIAGNOSIS — F209 Schizophrenia, unspecified: Secondary | ICD-10-CM | POA: Diagnosis present

## 2015-03-12 DIAGNOSIS — Z8619 Personal history of other infectious and parasitic diseases: Secondary | ICD-10-CM | POA: Insufficient documentation

## 2015-03-12 DIAGNOSIS — F1590 Other stimulant use, unspecified, uncomplicated: Secondary | ICD-10-CM

## 2015-03-12 DIAGNOSIS — F1390 Sedative, hypnotic, or anxiolytic use, unspecified, uncomplicated: Secondary | ICD-10-CM | POA: Diagnosis present

## 2015-03-12 DIAGNOSIS — R45851 Suicidal ideations: Secondary | ICD-10-CM

## 2015-03-12 DIAGNOSIS — F17210 Nicotine dependence, cigarettes, uncomplicated: Secondary | ICD-10-CM | POA: Diagnosis present

## 2015-03-12 DIAGNOSIS — F329 Major depressive disorder, single episode, unspecified: Secondary | ICD-10-CM | POA: Diagnosis not present

## 2015-03-12 DIAGNOSIS — F203 Undifferentiated schizophrenia: Secondary | ICD-10-CM | POA: Insufficient documentation

## 2015-03-12 DIAGNOSIS — F200 Paranoid schizophrenia: Secondary | ICD-10-CM | POA: Diagnosis present

## 2015-03-12 DIAGNOSIS — F1320 Sedative, hypnotic or anxiolytic dependence, uncomplicated: Secondary | ICD-10-CM | POA: Diagnosis present

## 2015-03-12 DIAGNOSIS — Z8719 Personal history of other diseases of the digestive system: Secondary | ICD-10-CM | POA: Insufficient documentation

## 2015-03-12 DIAGNOSIS — Z859 Personal history of malignant neoplasm, unspecified: Secondary | ICD-10-CM | POA: Insufficient documentation

## 2015-03-12 HISTORY — DX: Anxiety disorder, unspecified: F41.9

## 2015-03-12 HISTORY — DX: Unspecified viral hepatitis C without hepatic coma: B19.20

## 2015-03-12 LAB — CBC
HCT: 36.6 % — ABNORMAL LOW (ref 39.0–52.0)
Hemoglobin: 12.3 g/dL — ABNORMAL LOW (ref 13.0–17.0)
MCH: 29.9 pg (ref 26.0–34.0)
MCHC: 33.6 g/dL (ref 30.0–36.0)
MCV: 89.1 fL (ref 78.0–100.0)
Platelets: 56 10*3/uL — ABNORMAL LOW (ref 150–400)
RBC: 4.11 MIL/uL — AB (ref 4.22–5.81)
RDW: 14.8 % (ref 11.5–15.5)
WBC: 3 10*3/uL — ABNORMAL LOW (ref 4.0–10.5)

## 2015-03-12 LAB — ACETAMINOPHEN LEVEL: Acetaminophen (Tylenol), Serum: <10 ug/mL — ABNORMAL LOW (ref 10–30)

## 2015-03-12 LAB — ETHANOL: Alcohol, Ethyl (B): <5 mg/dL (ref <5)

## 2015-03-12 LAB — COMPREHENSIVE METABOLIC PANEL
ALK PHOS: 163 U/L — AB (ref 38–126)
ALT: 50 U/L (ref 17–63)
AST: 86 U/L — AB (ref 15–41)
Albumin: 3.9 g/dL (ref 3.5–5.0)
Anion gap: 6 (ref 5–15)
BILIRUBIN TOTAL: 1.4 mg/dL — AB (ref 0.3–1.2)
BUN: 20 mg/dL (ref 6–20)
CHLORIDE: 107 mmol/L (ref 101–111)
CO2: 24 mmol/L (ref 22–32)
Calcium: 8.9 mg/dL (ref 8.9–10.3)
Creatinine, Ser: 1.07 mg/dL (ref 0.61–1.24)
GFR calc Af Amer: >60 mL/min (ref >60)
Glucose, Bld: 151 mg/dL — ABNORMAL HIGH (ref 65–99)
Potassium: 3.9 mmol/L (ref 3.5–5.1)
Sodium: 137 mmol/L (ref 135–145)
TOTAL PROTEIN: 7.9 g/dL (ref 6.5–8.1)

## 2015-03-12 LAB — SALICYLATE LEVEL: Salicylate Lvl: <4 mg/dL (ref 2.8–30.0)

## 2015-03-12 MED ORDER — AMANTADINE HCL 100 MG PO CAPS
100.0000 mg | ORAL_CAPSULE | Freq: Every day | ORAL | Status: DC
  Filled 2015-03-12: qty 1

## 2015-03-12 MED ORDER — ONDANSETRON HCL 4 MG PO TABS: 4.0000 mg | ORAL_TABLET | Freq: Three times a day (TID) | ORAL | Status: DC | PRN

## 2015-03-12 MED ORDER — ACETAMINOPHEN 325 MG PO TABS: 650.0000 mg | ORAL_TABLET | Freq: Four times a day (QID) | ORAL | Status: DC | PRN

## 2015-03-12 MED ORDER — NICOTINE 21 MG/24HR TD PT24: 21.0000 mg | MEDICATED_PATCH | Freq: Every day | TRANSDERMAL | Status: DC

## 2015-03-12 MED ORDER — IBUPROFEN 800 MG PO TABS
800.0000 mg | ORAL_TABLET | Freq: Three times a day (TID) | ORAL | Status: DC | PRN
Start: 2015-03-12 — End: 2015-03-14
  Administered 2015-03-13: 800 mg via ORAL
  Filled 2015-03-12 (×2): qty 1

## 2015-03-12 MED ORDER — OLANZAPINE 10 MG PO TBDP
10.0000 mg | ORAL_TABLET | Freq: Three times a day (TID) | ORAL | Status: DC | PRN
  Administered 2015-03-12: 10 mg via ORAL
  Filled 2015-03-12: qty 1

## 2015-03-12 MED ORDER — LORAZEPAM 1 MG PO TABS
1.0000 mg | ORAL_TABLET | Freq: Three times a day (TID) | ORAL | Status: DC | PRN
  Administered 2015-03-12: 1 mg via ORAL
  Filled 2015-03-12: qty 1

## 2015-03-12 MED ORDER — AMPHETAMINE-DEXTROAMPHETAMINE 30 MG PO TABS
30.0000 mg | ORAL_TABLET | Freq: Two times a day (BID) | ORAL | Status: DC
  Filled 2015-03-12: qty 1

## 2015-03-12 MED ORDER — ALUM & MAG HYDROXIDE-SIMETH 200-200-20 MG/5ML PO SUSP: 30.0000 mL | ORAL | Status: DC | PRN

## 2015-03-12 MED ORDER — RISPERIDONE 1 MG PO TBDP
1.0000 mg | ORAL_TABLET | Freq: Two times a day (BID) | ORAL | Status: DC
  Administered 2015-03-13: 1 mg via ORAL
  Filled 2015-03-12 (×9): qty 1

## 2015-03-12 MED ORDER — HYDROXYZINE HCL 50 MG PO TABS
50.0000 mg | ORAL_TABLET | Freq: Four times a day (QID) | ORAL | Status: DC | PRN
  Administered 2015-03-13: 50 mg via ORAL
  Filled 2015-03-12 (×2): qty 1

## 2015-03-12 MED ORDER — ACETAMINOPHEN 325 MG PO TABS: 650.0000 mg | ORAL_TABLET | ORAL | Status: DC | PRN

## 2015-03-12 MED ORDER — IBUPROFEN 200 MG PO TABS: 600.0000 mg | ORAL_TABLET | Freq: Three times a day (TID) | ORAL | Status: DC | PRN

## 2015-03-12 MED ORDER — MAGNESIUM HYDROXIDE 400 MG/5ML PO SUSP: 30.0000 mL | Freq: Every day | ORAL | Status: DC | PRN

## 2015-03-12 MED ORDER — RISPERIDONE 1 MG PO TBDP
1.0000 mg | ORAL_TABLET | Freq: Two times a day (BID) | ORAL | Status: DC
  Filled 2015-03-12: qty 1

## 2015-03-12 MED ORDER — TRAZODONE HCL 100 MG PO TABS: 100.0000 mg | ORAL_TABLET | Freq: Every day | ORAL | Status: DC

## 2015-03-12 MED ORDER — ZOLPIDEM TARTRATE 5 MG PO TABS: 5.0000 mg | ORAL_TABLET | Freq: Every evening | ORAL | Status: DC | PRN

## 2015-03-12 MED ORDER — OLANZAPINE 10 MG PO TBDP: 10.0000 mg | ORAL_TABLET | Freq: Three times a day (TID) | ORAL | Status: DC | PRN

## 2015-03-12 MED ORDER — ZOLPIDEM TARTRATE 5 MG PO TABS
5.0000 mg | ORAL_TABLET | Freq: Every evening | ORAL | Status: DC | PRN
  Filled 2015-03-12: qty 1

## 2015-03-12 NOTE — Tx Team (Signed)
Initial Interdisciplinary Treatment Plan   PATIENT STRESSORS: Financial difficulties Health problems Medication change or noncompliance Substance abuse   PATIENT STRENGTHS: Capable of independent living General fund of knowledge Motivation for treatment/growth Supportive family/friends   PROBLEM LIST: Problem List/Patient Goals Date to be addressed Date deferred Reason deferred Estimated date of resolution  Anxiety      Suicidal thoughts-Risk for self harm- Denies on [facility] admission      Auditory and visual hallucinations      Delusions that someone is going to beat him up            "I am here for medication. I need my Adderall, Percocet, and Klonopin"                         DISCHARGE CRITERIA:  Adequate post-discharge living arrangements Improved stabilization in mood, thinking, and/or behavior Motivation to continue treatment in a less acute level of care Need for constant or close observation no longer present Verbal commitment to aftercare and medication compliance  PRELIMINARY DISCHARGE PLAN: Attend aftercare/continuing care group Attend PHP/IOP Return to previous living arrangement  PATIENT/FAMIILY INVOLVEMENT: This treatment plan has been presented to and reviewed with the patient, Rodney Reed, and/or family member.  The patient and family have been given the opportunity to ask questions and make suggestions.  [name] [facility] 03/12/2015, 10:56 PM

## 2015-03-12 NOTE — Progress Notes (Signed)
Pt continues to lay or stand in front of the 500 hall nurse's station.  He still refuses to take any medication.  He still refuses to believe that he is safe and that there are no men on the hall that are going to "jump him".  He says that there is a man named "Frederico Hamman" who is down the hall that wants to hurt him, but he has been told that there is no one named Spencer on the unit.  Staff continues to monitor pt's safety q15 minutes.

## 2015-03-12 NOTE — ED Notes (Addendum)
Patient states he is suicidal and does not care any more. Patient denies having a plan. Patient denies hurting other people. Patient is tearful and states I do not have any fight in me anymore. Patient states he has not left his apartment in the last 3 months except to go to the store. Patient denies any auditory or visual hallucinations.  Patient added that he did feel like going into the kitchen, getting a knife and cutting his throat.

## 2015-03-12 NOTE — ED Provider Notes (Signed)
CSN: [id number]     Arrival date & time 03/12/15  0906 History   First MD Initiated Contact with Patient 03/12/15 [phone]     Chief Complaint  Patient presents with  . Suicidal     (Consider location/radiation/quality/duration/timing/severity/associated sxs/prior Treatment) HPI   Rodney Reed 49 y.o.male  PCP: PROVIDER NOT IN SYSTEM  Blood pressure 154/68, pulse 89, temperature 98.2 F (36.8 C), temperature source Oral, resp. rate 16, height 5' 11.75" (1.822 m), weight 205 lb (92.987 kg), SpO2 100 %.  SIGNIFICANT PMH: back pain, cancer, cirrhosis, pancreatitis, schizophrenia- paranoid CHIEF COMPLAINT: suicidal ideation and depression.  When: Over 3 months How: Patient has been depressed and not leaving apartment for over the past 3 months due to depression unless he needed to go to the store or the ER. He feels that his depression is overwhelming. He is now suicidal with a plan of going into the kitchen to get a knife and cut his throat. He denies doing anything to harm himself today or yesterday. He has not had any auditory or visual hallucinations.   Negative ROS: Confusion, diaphoresis, fever, headache, weakness (general or focal), change of vision,  neck pain, dysphagia, aphagia, chest pain, shortness of breath,  back pain, abdominal pains, nausea, vomiting, diarrhea, lower extremity swelling, rash.   Past Medical History  Diagnosis Date  . Back pain   . Cancer   . Cirrhosis   . Pancreatitis   . Schizophrenia, paranoid   . Hepatitis C    Past Surgical History  Procedure Laterality Date  . Tonsillectomy Bilateral as infant   Family History  Problem Relation Age of Onset  . Hypertension Mother   . Diabetes Mother    Social History  Substance Use Topics  . Smoking status: Current Every Day Smoker -- 0.10 packs/day for 33 years    Types: Cigarettes  . Smokeless tobacco: None     Comment: trying to cut back  . Alcohol Use: 1.2 oz/week    2 Cans of beer per week      Comment: occasionally    Review of Systems  10 Systems reviewed and are negative for acute change except as noted in the HPI.    Allergies  Vancomycin and Fish allergy  Home Medications   Prior to Admission medications   Medication Sig Start Date End Date Taking? Authorizing Provider  amphetamine-dextroamphetamine (ADDERALL) 30 MG tablet Take 30 mg by mouth 2 (two) times daily.   Yes Historical Provider, MD  clonazePAM (KLONOPIN) 1 MG tablet Take 1 mg by mouth 2 (two) times daily as needed for anxiety.    Yes Historical Provider, MD  ibuprofen (ADVIL,MOTRIN) 800 MG tablet Take 800 mg by mouth 3 (three) times daily as needed for headache, mild pain or moderate pain.  03/04/15  Yes Historical Provider, MD  oxyCODONE-acetaminophen (PERCOCET) 7.5-325 MG per tablet Take 1 tablet by mouth every 6 (six) hours as needed for moderate pain or severe pain.  03/04/15  Yes Historical Provider, MD  Daclatasvir Dihydrochloride 60 MG TABS Take 60 mg by mouth daily. Patient not taking: Reported on 02/08/2015 09/05/14   Thayer Headings, MD  oxyCODONE (ROXICODONE) 5 MG immediate release tablet Take 1-2 tabs every 6-8 hours for pain Patient not taking: Reported on 10/03/2014 08/31/13   Noland Fordyce, PA-C  penicillin v potassium (VEETID) 500 MG tablet Take 1 tablet (500 mg total) by mouth 4 (four) times daily. Patient not taking: Reported on 10/03/2014 11/29/13   Larene Pickett, PA-C  ribavirin (COPEGUS) 200 MG tablet 600 mg in am Patient not taking: Reported on 02/08/2015 12/12/14   Thayer Headings, MD  Sofosbuvir 400 MG TABS Take 400 mg by mouth daily. Patient not taking: Reported on 02/08/2015 09/05/14   Thayer Headings, MD  traMADol (ULTRAM) 50 MG tablet Take 1 tablet (50 mg total) by mouth every 6 (six) hours as needed. Patient not taking: Reported on 12/12/2014 11/05/14   Britt Bottom, NP   BP 154/68 mmHg  Pulse 89  Temp(Src) 98.2 F (36.8 C) (Oral)  Resp 16  Ht 5' 11.75" (1.822 m)  Wt 205 lb (92.987 kg)   BMI 28.01 kg/m2  SpO2 100% Physical Exam  Constitutional: He appears well-developed and well-nourished. No distress.  HENT:  Head: Normocephalic and atraumatic.  Eyes: Pupils are equal, round, and reactive to light.  Neck: Normal range of motion. Neck supple.  Cardiovascular: Normal rate and regular rhythm.   Pulmonary/Chest: Effort normal.  Abdominal: Soft.  Neurological: He is alert.  Skin: Skin is warm and dry.  Psychiatric: His speech is normal and behavior is normal. His mood appears anxious. He exhibits a depressed mood. He expresses suicidal ideation. He expresses no homicidal ideation. He expresses suicidal plans. He expresses no homicidal plans.  + tearful  Nursing note and vitals reviewed.   ED Course  Procedures (including critical care time) Labs Review Labs Reviewed  COMPREHENSIVE METABOLIC PANEL - Abnormal; Notable for the following:    Glucose, Bld 151 (*)    AST 86 (*)    Alkaline Phosphatase 163 (*)    Total Bilirubin 1.4 (*)    All other components within normal limits  ACETAMINOPHEN LEVEL - Abnormal; Notable for the following:    Acetaminophen (Tylenol), Serum <10 (*)    All other components within normal limits  CBC - Abnormal; Notable for the following:    WBC 3.0 (*)    RBC 4.11 (*)    Hemoglobin 12.3 (*)    HCT 36.6 (*)    Platelets 56 (*)    All other components within normal limits  ETHANOL  SALICYLATE LEVEL  URINE RAPID DRUG SCREEN, HOSP PERFORMED    Imaging Review No results found. I have personally reviewed and evaluated these images and lab results as part of my medical decision-making.   EKG Interpretation None      MDM   Final diagnoses:  Suicidal ideations    Medically cleared for psych evaluation Home meds reviewed and orders placed Lab results reviewed. TTS consult ordered.  Filed Vitals:   03/12/15 0918  BP: 154/68  Pulse: 89  Temp: 98.2 F (36.8 C)  Resp: 16  .     Delos Haring, PA-C 03/12/15  [place], MD 03/14/15 [phone]

## 2015-03-12 NOTE — Progress Notes (Signed)
Vol admit, 49 yo male, who called 911 from his home, stating that he was having suicidal thoughts which started several weeks ago.  Pt was calm and cooperative at the ED per report, but on admission to [facility], pt came onto the unit stating that he needed his "klonopin 1 mg, Percocet 7.5(2 tabs), and his Adderall 30 mg.  Paperwork was signed and search was completed.  Pt was told that writer would speak with the provider on call and get orders for tonight, but that those medications may not be ordered until he speaks to the MD in the morning.  Pt had been calm and cooperative with Probation officer throughout the admission until he found out he would not be receiving those medications.  He told one tech he was seeing 3 men on the roof who were coming in to jump him, but then he denied with Probation officer.  Orders were received for pt, but he is refusing the meds.  Pt demanding to speak with security or "the head nurse".  The CN came to speak with the pt.  He says there are people here that are coming to beat him up.  He will not accept staff assurance that he is safe and that there is no one that will assault him.  He was offered to sleep in the quiet room, but he will not if we do not lock the door.  Staff explained that we cannot lock the door.  Pt is at this time lying in the floor in front of the 500 hall nurse's station.  Pt is safe at this time.

## 2015-03-12 NOTE — BH Assessment (Signed)
[place] Assessment Progress Note   Per Corena Pilgrim, MD, this pt requires psychiatric hospitalization at this time.  Letitia Libra, RN, [facility] has assigned pt to [facility] Rm [id number].  She requests that pt be transferred as close to 20:00 as possible.  Pt has signed Voluntary Admission and Consent for Treatment, but refuses to sign Consent to Release Information.  Signed form has been faxed to [facility].  Pt's nurse, Jan, has been notified, and agrees to send original paperwork along with pt via [facility], and to call report to [phone].  Jalene Mullet, [place] Triage Specialist [phone]

## 2015-03-12 NOTE — ED Notes (Signed)
Pt brought to unit voluntary with si thoughts and a plan to cut his throat. Pt reports that he fell 40 ft in April hitting his head on rocks climbing a rock wall in the woods. Pt reports staying inside his mother's apartment for the past month only leaving to pick up things for his mother. Pt says "I don't care to live no more." When asked about hi thoughts he said that he has no specific thoughts to hurt others at this time. Pt says that he has a "bad temper." He reports being in prison for 15 yrs for stealing and using cocaine. He has been out for eleven years and last use of cocaine was three years ago. He reports having a herniated disc and no disc between L1 and L4. He says that he was in the hospital with a coma in the past due to vancomycin.  He has a hx of hep C, liver cancer and schizophrenia.

## 2015-03-12 NOTE — ED Notes (Signed)
Pt observed standing in the doorway making hand gestures and talking to no one in the hall. Pt is paranoid and saying that people are trying to hurt him. Reinforced safety. Gave prn Zyprexa. Safety maintained.

## 2015-03-12 NOTE — BH Assessment (Addendum)
Assessment Note  Rodney Reed is an 48 y.o. male with history of Paranoid Schizophrenia. Patient called 911 today for help with his suicidal ideations. Onset of suicidal ideations started several weeks ago. Patient has a plan to "cut my throat". He has access to sharp objects. Patient is unable to contract for safety.  He denies history of suicide attempts/gestures. Patient denies history of self mutilating behaviors.   Patient denies HI. He also denies AVH's. However, patient refers to staff laughing at him outside of his room. Pt also refers to a ex girlfriend "Anderson Malta" outside of his room "telling all of my business". Writer informed the patient that no one was outside of his room. Patient continued to express his frustration about "Anderson Malta" stating, "God will get her".   Patient denies alcohol and drug use. He reports a past history of THC and cocaine use. Last reported use was 7 or more years ago"  Patient does not have a outpatient therapist or psychiatrist.   Axis I: Schizophrenia Axis II: Deferred Axis III:  Past Medical History  Diagnosis Date  . Back pain   . Cancer   . Cirrhosis   . Pancreatitis   . Schizophrenia, paranoid   . Hepatitis C    Axis IV: other psychosocial or environmental problems, problems related to social environment, problems with access to health care services and problems with primary support group Axis V: 31-40 impairment in reality testing  Past Medical History:  Past Medical History  Diagnosis Date  . Back pain   . Cancer   . Cirrhosis   . Pancreatitis   . Schizophrenia, paranoid   . Hepatitis C     Past Surgical History  Procedure Laterality Date  . Tonsillectomy Bilateral as infant    Family History:  Family History  Problem Relation Age of Onset  . Hypertension Mother   . Diabetes Mother     Social History:  reports that he has been smoking Cigarettes.  He has a 3.3 pack-year smoking history. He does not have any smokeless tobacco  history on file. He reports that he drinks about 1.2 oz of alcohol per week. He reports that he does not use illicit drugs.  Additional Social History:  Alcohol / Drug Use Pain Medications: SEE MAR Prescriptions: SEE MAR Over the Counter: SEE MAR History of alcohol / drug use?:  ("I have a history of cocaine and THC" and "I haven't used in years")  CIWA: CIWA-Ar BP: 154/68 mmHg Pulse Rate: 89 COWS:    Allergies:  Allergies  Allergen Reactions  . Vancomycin Anaphylaxis  . Fish Allergy Other (See Comments)    Hot flash, dizziness    Home Medications:  (Not in a hospital admission)  OB/GYN Status:  No LMP for male patient.  General Assessment Data Location of Assessment: [facility] TTS Assessment: In system Is this a Tele or Face-to-Face Assessment?: Face-to-Face Is this an Initial Assessment or a Re-assessment for this encounter?: Initial Assessment Marital status: Divorced [place] name:  (n/a) Is patient pregnant?: No Pregnancy Status: No Living Arrangements: Parent ("I live with mother") Can pt return to current living arrangement?: Yes Admission Status: Voluntary Is patient capable of signing voluntary admission?: Yes Referral Source: Self/Family/Friend Insurance type:  (Medicaid )  Medical Screening Exam ([place]) Medical Exam completed: No  Crisis Care Plan Living Arrangements: Parent ("I live with mother") Name of Psychiatrist: [facility]ack in [facility] Name of Therapist: [facility]   Education Status Is patient  currently in school?: No Current Grade:  (n/a) Highest grade of school patient has completed: 9 Name of school: NA Contact person: NA  Risk to self with the past 6 months Suicidal Ideation: Yes-Currently Present Has patient been a risk to self within the past 6 months prior to admission? : Yes Suicidal Intent: Yes-Currently Present Has patient had any suicidal intent within the past 6 months prior to admission? : Yes Is  patient at risk for suicide?: Yes Suicidal Plan?: Yes-Currently Present Has patient had any suicidal plan within the past 6 months prior to admission? : Yes Specify Current Suicidal Plan:  ("Cut my throat") Access to Means: Yes Specify Access to Suicidal Means:  (sharp objects) What has been your use of drugs/alcohol within the last 12 months?:  (patient denies ) Previous Attempts/Gestures: No How many times?:  (0) Other Self Harm Risks:  (n/a) Triggers for Past Attempts: None known Intentional Self Injurious Behavior: None Family Suicide History: No Recent stressful life event(s): Other (Comment) ("I fell 40 feet into rocks.Marland KitchenApril 2016...haven't been right") Persecutory voices/beliefs?: No Depression: Yes Depression Symptoms: Feeling angry/irritable, Feeling worthless/self pity, Loss of interest in usual pleasures, Guilt, Isolating, Tearfulness, Insomnia, Despondent, Fatigue Substance abuse history and/or treatment for substance abuse?: No Suicide prevention information given to non-admitted patients: Not applicable  Risk to Others within the past 6 months Homicidal Ideation: No Does patient have any lifetime risk of violence toward others beyond the six months prior to admission? : No Thoughts of Harm to Others: No Current Homicidal Intent: No Current Homicidal Plan: No Access to Homicidal Means: No Identified Victim:  (n/a) History of harm to others?: Yes Assessment of Violence: None Noted Violent Behavior Description:  ("I have a history of assault charges") Does patient have access to weapons?: No Criminal Charges Pending?: No Does patient have a court date: Yes ("I have to go to court for child support sometime in Oct") Court Date:  (patient not sure of court date) Is patient on probation?: No  Psychosis Hallucinations: None noted Delusions: None noted  Mental Status Report Appearance/Hygiene: Unremarkable Eye Contact: Poor Motor Activity: Freedom of  movement Speech: Logical/coherent, Soft Level of Consciousness: Alert Mood: Depressed Affect: Appropriate to circumstance Anxiety Level: None Thought Processes: Relevant Judgement: Impaired Orientation: Person, Place, Time, Situation Obsessive Compulsive Thoughts/Behaviors: None  Cognitive Functioning Concentration: Normal Memory: Remote Intact, Recent Intact IQ: Average Insight: Good Impulse Control: Fair Appetite: Poor Weight Loss:  (none reported) Weight Gain:  (none reported) Sleep: No Change Total Hours of Sleep:  (8 hrs or more ) Vegetative Symptoms: None  ADLScreening [facility] Assessment Services) Patient's cognitive ability adequate to safely complete daily activities?: Yes Patient able to express need for assistance with ADLs?: Yes Independently performs ADLs?: Yes (appropriate for developmental age)  Prior Inpatient Therapy Prior Inpatient Therapy: Yes Prior Therapy Dates: unknown in the past Prior Therapy Facilty/Provider(s): [facility]  Reason for Treatment: schizophrenia   Prior Outpatient Therapy Prior Outpatient Therapy: Yes Prior Therapy Dates[facility] Rowe Clack  Prior Therapy Facilty/Provider(s): Dr, Rowe Clack and [facility]  Reason for Treatment: schizophrenia and ADHD Does patient have an ACCT team?: No Does patient have Intensive In-House Services?  : No Does patient have [facility] services? : No Does patient have [facility] services?: No  ADL Screening (condition at time of admission) Patient's cognitive ability adequate to safely complete daily activities?: Yes Is the patient deaf or have difficulty hearing?: No Does the patient have difficulty seeing,  even when wearing glasses/contacts?: No Does the patient have difficulty concentrating, remembering, or making decisions?: No Patient able to express need for assistance with ADLs?: Yes Does the patient have difficulty dressing or bathing?: No Independently  performs ADLs?: Yes (appropriate for developmental age) Does the patient have difficulty walking or climbing stairs?: No Weakness of Legs: None Weakness of Arms/Hands: None  Home Assistive Devices/Equipment Home Assistive Devices/Equipment: None    Abuse/Neglect Assessment (Assessment to be complete while patient is alone) Physical Abuse: Denies Verbal Abuse: Denies Sexual Abuse: Denies Exploitation of patient/patient's resources: Denies Self-Neglect: Denies Values / Beliefs Cultural Requests During Hospitalization: None Spiritual Requests During Hospitalization: None   Advance Directives (For Healthcare) Does patient have an advance directive?: No Would patient like information on creating an advanced directive?: No - patient declined information    Additional Information 1:1 In Past 12 Months?: No CIRT Risk: No Elopement Risk: No Does patient have medical clearance?: Yes     Disposition:  Disposition Initial Assessment Completed for this Encounter: Yes Disposition of Patient: Inpatient treatment program Reginold Agent, NP recommends inpatient treatment;) Type of inpatient treatment program: Adult (pending placement by TTS)  On Site Evaluation by:   Reviewed with Physician:    Waldon Merl [facility] 03/12/2015 11:29 AM

## 2015-03-13 ENCOUNTER — Encounter (HOSPITAL_COMMUNITY): Payer: Self-pay | Admitting: Psychiatry

## 2015-03-13 DIAGNOSIS — F203 Undifferentiated schizophrenia: Secondary | ICD-10-CM

## 2015-03-13 DIAGNOSIS — F1320 Sedative, hypnotic or anxiolytic dependence, uncomplicated: Secondary | ICD-10-CM | POA: Diagnosis present

## 2015-03-13 DIAGNOSIS — F1590 Other stimulant use, unspecified, uncomplicated: Secondary | ICD-10-CM

## 2015-03-13 DIAGNOSIS — F1599 Other stimulant use, unspecified with unspecified stimulant-induced disorder: Secondary | ICD-10-CM

## 2015-03-13 DIAGNOSIS — F1390 Sedative, hypnotic, or anxiolytic use, unspecified, uncomplicated: Secondary | ICD-10-CM

## 2015-03-13 MED ORDER — OLANZAPINE 5 MG PO TABS: 5.0000 mg | ORAL_TABLET | Freq: Every day | ORAL | Status: DC

## 2015-03-13 MED ORDER — CHLORDIAZEPOXIDE HCL 25 MG PO CAPS
25.0000 mg | ORAL_CAPSULE | Freq: Four times a day (QID) | ORAL | Status: DC | PRN
  Administered 2015-03-13: 25 mg via ORAL
  Filled 2015-03-13 (×2): qty 1

## 2015-03-13 NOTE — BHH Group Notes (Signed)
[place] Group Notes:  (Nursing/MHT/Case Management/Adjunct)  Date:  03/13/2015  Time:  5:54 PM  Type of Therapy:  self esteem  Participation Level:  Did Not Attend  Participation Quality:  did not attend  Affect:  did not attend  Cognitive:  did not attend  Insight:  None  Engagement in Group:  None  Modes of Intervention:  did not attend  Summary of Progress/Problems-- did not attend  :  Rodney Reed, Rodney Reed 03/13/2015, 5:54 PM

## 2015-03-13 NOTE — Tx Team (Signed)
Interdisciplinary Treatment Plan Update (Adult)  Date:  03/13/2015   Time Reviewed:  8:37 AM   Progress in Treatment: Attending groups: No Participating in groups:  No Taking medication as prescribed:  Yes. Tolerating medication:  Yes. Family/Significant othe contact made:  No  Patient understands diagnosis: No  Limited insight.  Insists he needs his outpt meds, all controlled substances.  Was positive for cocaine at admission Discussing patient identified problems/goals with staff:  Yes, see initial care plan. Medical problems stabilized or resolved:  Yes. Denies suicidal/homicidal ideation: Yes. Issues/concerns per patient self-inventory:  No. Other:  New problem(s) identified:  Discharge Plan or Barriers: likely return home, follow up outpt  Reason for Continuation of Hospitalization: Depression Medication stabilization Suicidal ideation  Comments:  Rodney Reed is a 49 y.o. male patient does not want treatment without opiates, Adderall, and benzodiazepines.   The patient became belligerent today when his opiates, Adderall, and Klonopin got discontinued. He started cursing, threatening, demanding, yelling, and not wanting to stay without these medications. Security was called and he was escorted off the unit per the psychiatrist's request. His mother was concerned and this provider talked with her along with the case manager later about substance abuse resources for her son. She reported he has been "banned" from [facility] and is not allowed back there.  The above note was recorded by Dr Darleene Cleaver in the ED on 02/10/15.  Risperdal trial   Estimated length of stay: 1-3 days  New goal(s):  Review of initial/current patient goals per problem list:   Review of initial/current patient goals per problem list:  1. Goal(s): Patient will participate in aftercare plan   Met: No   Target date: 3-5 days post admission date   As evidenced by: Patient will participate  within aftercare plan AEB aftercare provider and housing plan at discharge being identified. 03/13/15  Pt unwilling to engage in conversation re: dispositional plan     2. Goal (s): Patient will exhibit decreased depressive symptoms and suicidal ideations.   Met: No   Target date: 3-5 days post admission date   As evidenced by: Patient will utilize self rating of depression at 3 or below and demonstrate decreased signs of depression or be deemed stable for discharge by MD. 03/13/15  Pt was c/o SI at admission.  Unable to rate now as he is not willing to answer questions about SI and depression.     4. Goal(s): Patient will demonstrate decreased signs of withdrawal due to substance abuse   Met: No   Target date: 3-5 days post admission date   As evidenced by: Patient will produce a CIWA/COWS score of 0, have stable vitals signs, and no symptoms of withdrawal 03/13/2015   Pt has COWS score of 10.  Refusing librium "because it is not what I take at home."    5. Goal(s): Patient will demonstrate decreased signs of psychosis  * Met: No  * Target date: 3-5 days post admission date  * As evidenced by: Patient will demonstrate decreased frequency of AVH or return to baseline function 03/13/2015  Pt exhibiting what was described as paranoia prior to admission to [facility]  Not taking any oupt meds to address this symptom          Attendees: Patient:  03/13/2015 8:37 AM   Family:   03/13/2015 8:37 AM   Physician:  Ursula Alert, MD 03/13/2015 8:37 AM   Nursing:   Ashby Dawes, RN 03/13/2015 8:37 AM   CSW:  Roque Lias, [place]   03/13/2015 8:37 AM   Other:  03/13/2015 8:37 AM   Other:   03/13/2015 8:37 AM   Other:  Lars Pinks, Nurse CM 03/13/2015 8:37 AM   Other:  Lucinda Dell, [facility] 03/13/2015 8:37 AM   Other:  Norberto Sorenson, [place]  03/13/2015 8:37 AM   Other:  03/13/2015 8:37 AM   Other:  03/13/2015 8:37 AM   Other:  03/13/2015 8:37 AM   Other:  03/13/2015 8:37 AM   Other:  03/13/2015  8:37 AM   Other:   03/13/2015 8:37 AM    Scribe for Treatment Team:   Trish Mage, 03/13/2015 8:37 AM

## 2015-03-13 NOTE — Progress Notes (Signed)
D  ---  Pt. Refused 1700 hr. Scheduled medication and also PRN Librium that was offered.  He said he only wants  " my Percoset that I take outside for my back pain ".   Pt. Stays lying in bed facing the wall .  He speaks in a labile, irritable tone , but shows no behavior issues toward staff.

## 2015-03-13 NOTE — Progress Notes (Signed)
Patient lacks capacity to participate in disposition planning.  Sanford Lindblad ,MD Attending Psychiatrist  [facility]  

## 2015-03-13 NOTE — Progress Notes (Signed)
Psychoeducational Group Note  Date:  03/13/2015 Time:  2125  Group Topic/Focus:  Wrap-Up Group:   The focus of this group is to help patients review their daily goal of treatment and discuss progress on daily workbooks.  Participation Level: Did Not Attend  Participation Quality:  Not Applicable  Affect:  Not Applicable  Cognitive:  Not Applicable  Insight:  Not Applicable  Engagement in Group: Not Applicable  Additional Comments:  The patient did not attend group this evening after refusing to do so.   Archie Balboa S 03/13/2015, 9:26 PM

## 2015-03-13 NOTE — Clinical Social Work Note (Signed)
Went to speak with patient in his room.  Found him lying in bed, engageable.  I asked if I could interview him.  He refused.  "I just want to talk to my therapist at [facility]  I offered to call there and ask her to call back after he signed a release.  He refused after looking it over.  "I have too much f------ back pain to do anything right now, and nobody is doing anything about it here."  Offered to give him phone number to [facility].  He turned away and put the covers back over his head.  Irritable, uncooperative.

## 2015-03-13 NOTE — Progress Notes (Signed)
Patient ID: Rodney Reed, male   DOB: 12/27/1965, 49 y.o.   MRN: [id number] D  --   Pt. Started day lying in bed with no complaints.  Pt. Takes meds with no apparent adverse reactions.  He had CIWA score of  -10 - At 1200 hrs and was becoming highly agitated.  Pt. Refused PRN LIbium and said "only my STREET PRESCRIPTIONS will help me when I get like this ".    Pt. Is angry that he is not ordered Percoset for  "MY constant back pain because of my vertebras are messed up ".   Pt. Has been able to keep his behavior under control up to this point, but remains in an escalated state.     Pt. Has no interaction with peers and stays to himself.   Pt. Does contract for safety He appears to be feeling worse as the day goes on  ---  A --  Support as needed  --- R --  Pt. Remains safe but labile on unit

## 2015-03-13 NOTE — BHH Suicide Risk Assessment (Signed)
[facility] Admission Suicide Risk Assessment   Nursing information obtained from:  Patient, Review of record Demographic factors:  Male, Caucasian, Low socioeconomic status, Unemployed Current Mental Status:  Self-harm thoughts Loss Factors:  Decline in physical health, Financial problems / change in socioeconomic status Historical Factors:  NA Risk Reduction Factors:  Sense of responsibility to family, Living with another person, especially a relative, Positive social support Total Time spent with patient: 30 minutes Principal Problem: Schizophrenia Diagnosis:   Patient Active Problem List   Diagnosis Date Noted  . 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 Schizophrenia [F20.9] 03/12/2015  . Anxiety [F41.9] 02/08/2015  . 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 Hepatic cirrhosis [K74.60] 09/05/2014  . Hepatitis C virus infection without hepatic coma [B19.20] 10/29/2013  . Chronic back pain [M54.9, G89.29] 10/22/2013     Continued Clinical Symptoms:  Alcohol Use Disorder Identification Test Final Score (AUDIT): 1 The "Alcohol Use Disorders Identification Test", Guidelines for Use in Primary Care, Second Edition.  World Pharmacologist [facility]). Score between 0-7:  no or low risk or alcohol related problems. Score between 8-15:  moderate risk of alcohol related problems. Score between 16-19:  high risk of alcohol related problems. Score 20 or above:  warrants further diagnostic evaluation for alcohol dependence and treatment.   CLINICAL FACTORS:   Severe Anxiety and/or Agitation Alcohol/Substance Abuse/Dependencies More than one psychiatric diagnosis Unstable or Poor Therapeutic Relationship Previous Psychiatric Diagnoses and Treatments   Musculoskeletal: Strength & Muscle Tone: within normal limits Gait & Station: normal Patient leans: N/A  Psychiatric Specialty Exam: Physical Exam  Review of Systems   Unable to perform ROS: acuity of condition    Blood pressure 117/63, pulse 91, temperature 97.7 F (36.5 C), temperature source Oral, resp. rate 16, height 6' (1.829 m), weight 97.523 kg (215 lb), SpO2 99 %.Body mass index is 29.15 kg/(m^2).                  Please see H&P.                                        COGNITIVE FEATURES THAT CONTRIBUTE TO RISK:  Closed-mindedness, Polarized thinking and Thought constriction (tunnel vision)    SUICIDE RISK:   Mild:  Suicidal ideation of limited frequency, intensity, duration, and specificity.  There are no identifiable plans, no associated intent, mild dysphoria and related symptoms, good self-control (both objective and subjective assessment), few other risk factors, and identifiable protective factors, including available and accessible social support.  PLAN OF CARE: Please see H&P.   Medical Decision Making:  Review of Psycho-Social Stressors (1), Review or order clinical lab tests (1), Established Problem, Worsening (2), Review of Last Therapy Session (1), Review of Medication Regimen & Side Effects (2) and Review of New Medication or Change in Dosage (2)  I certify that inpatient services furnished can reasonably be expected to improve the patient's condition.   Gilmore List MD 03/13/2015, 10:31 AM

## 2015-03-13 NOTE — Progress Notes (Signed)
Pt was in bed at the beginning of the shift and refused lab draw.  He is also refusing the EKG for now.  Will attempt again in the AM.  Pt's main focus and concern is his back pain and not getting his Percocets.  He says that is the only med that will relieve his pain.  He is upset that the MD will not order it for him.  Writer was able to get him to take the Ibuprofen 800 mg along with Librium and Vistaril shortly before 2100, but he was already sure that it would not help.  He also was given 2 heat packs.  He has stayed in his room most of the evening, but came out a short time ago to pace in the hall.  Pt denies SI/HI/AVH.  He says he wants to be discharged tomorrow so he can get the medications that will help him.  He has not attended groups and does not want to participate.  He is irritable when interacting with staff, but he has remained calm and in control of his behavior thus far.  Pt makes his needs known to staff.  Support and encouragement offered.  Safety maintained with q15 minute checks.

## 2015-03-13 NOTE — BHH Group Notes (Signed)
[place] Group Notes:  (Counselor/Nursing/MHT/Case Management/Adjunct)  03/13/2015 1:15PM  Type of Therapy:  Group Therapy  Participation Level: Invited.  Refused to attend  Summary of Progress/Problems: The topic for group was balance in life.  Pt participated in the discussion about when their life was in balance and out of balance and how this feels.  Pt discussed ways to get back in balance and short term goals they can work on to get where they want to be.    Trish Mage 03/13/2015 3:51 PM

## 2015-03-13 NOTE — H&P (Signed)
Psychiatric Admission Assessment Adult  Patient Identification: Rodney Reed MRN:  [id number] Date of Evaluation:  03/13/2015 Chief Complaint:Patient states " I want my klonopin and Adderrall , those are the medications that help, I do not want anything else that you prescribe.'    Principal Diagnosis: Schizophrenia Diagnosis:   Patient Active Problem List   Diagnosis Date Noted  . 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 Schizophrenia [F20.9] 03/12/2015  . Anxiety [F41.9] 02/08/2015  . 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 Hepatic cirrhosis [K74.60] 09/05/2014  . Hepatitis C virus infection without hepatic coma [B19.20] 10/29/2013  . Chronic back pain [M54.9, G89.29] 10/22/2013      History of Present Illness:: Rodney Reed is a 49 y.o. White male with history of Paranoid Schizophrenia. Per initial notes in EHR " Patient called 911  for help with his suicidal ideations that started several weeks ago , has a plan to cut his throat , has access to sharp objects."  Also per notes on 02/10/2015 - by Dr.Akintayo - " Rodney Reed is a 49 y.o. male patient does not want treatment without opiates, Adderall, and benzodiazepines.The patient became belligerent today when his opiates, Adderall, and Klonopin got discontinued. He started cursing, threatening, demanding, yelling, and not wanting to stay without these medications. Security was called and he was escorted off the unit per the psychiatrist's request. His mother was concerned and this provider talked with her along with the case manager later about substance abuse resources for her son. She reported he has been "banned" from [facility] and is not allowed back there. "Patient during that visit was provided with substance abuse resources .  Patient seen and chart reviewed.Discussed patient with treatment team. Patient when this writer initially attempted to  evaluate patient - reported that he wanted the evaluation to be done in his room and not in writer's office since he was feeling tired. Writer hence agreed to do the evaluation in his room. Pt started talking about his schizophrenia , and being paranoid to the point that he could not walk out of his house anymore and also about AH. Pt reported AH asks him to kill self at times , but did not elaborate on it. However , when writer started asking questions about his medications , and told the patient that Klonopin is not the treatment of choice for schizophrenia - pt got extremely agitated , started yelling , cursing and the techs on the unit had to come to the room inorder to make sure pt does not get more aggressive . Pt reported " Adderal, klonopin and opioids are the only medications that work for me , I want my medications, I do not want anything else". Pt thereafter did not want to cooperate with the evaluation.   Elements:  Location:  psychosis , substance abuse. Quality:  see above. Severity:  severe. Timing:  acute. Duration:  past few days. Context:  hx of substance dependence, noncompliance with medications for schizophrenia. Associated Signs/Symptoms: Depression Symptoms:  depressed mood, (Hypo) Manic Symptoms:  Distractibility, Hallucinations, Impulsivity, Irritable Mood, Labiality of Mood, Anxiety Symptoms:  Excessive Worry, Psychotic Symptoms:  Hallucinations: Auditory paranoia PTSD Symptoms: Negative Total Time spent with patient: 45 minutes  Past Medical History:  Past Medical History  Diagnosis Date  . Back pain   . Cancer   . Cirrhosis   . Pancreatitis   . Schizophrenia, paranoid   . Hepatitis C   . Anxiety  Past Surgical History  Procedure Laterality Date  . Tonsillectomy Bilateral as infant   Family History: patient unable to give hx of mental illness in his family Family History  Problem Relation Age of Onset  . Hypertension Mother   . Diabetes Mother     Social History:  History  Alcohol Use  . 1.2 oz/week  . 2 Cans of beer per week    Comment: occasionally     History  Drug Use No    Comment:  hx of heroin use; sts former use    Social History   Social History  . Marital Status: Legally Separated    Spouse Name: N/A  . Number of Children: N/A  . Years of Education: N/A   Social History Main Topics  . Smoking status: Current Every Day Smoker -- 0.50 packs/day for 33 years    Types: Cigarettes  . Smokeless tobacco: None     Comment: trying to cut back  . Alcohol Use: 1.2 oz/week    2 Cans of beer per week     Comment: occasionally  . Drug Use: No     Comment:  hx of heroin use; sts former use  . Sexual Activity: Not Currently   Other Topics Concern  . None   Social History Narrative   Additional Social History:    Pain Medications: See home med list Prescriptions: See home med list Over the Counter: See home med list History of alcohol / drug use?: Yes (Reports not used in years, but + cocaine last month)                     Musculoskeletal: Strength & Muscle Tone: within normal limits Gait & Station: normal Patient leans: Right  Psychiatric Specialty Exam: Physical Exam  Constitutional:  I concur with PE done in [facility]    Review of Systems  Unable to perform ROS: acuity of condition    Blood pressure 117/63, pulse 91, temperature 97.7 F (36.5 C), temperature source Oral, resp. rate 16, height 6' (1.829 m), weight 97.523 kg (215 lb), SpO2 99 %.Body mass index is 29.15 kg/(m^2).  General Appearance: Disheveled  Eye Contact::  None  Speech:  normal rate  Volume:  yelling , loud  Mood:  Irritable  Affect:  Labile  Thought Process:  Irrelevant  Orientation:  Other:  to person , place  Thought Content:  Hallucinations: Auditory, Paranoid Ideation and Rumination  Suicidal Thoughts:  did not express any - but was SI on admission  Homicidal Thoughts:  No  Memory:  Immediate;   Fair Recent;    Fair Remote;   Fair  Judgement:  Impaired  Insight:  Shallow  Psychomotor Activity:  Restlessness  Concentration:  Poor  Recall:  [employer] of Knowledge:Fair  Language: Fair  Akathisia:  No  Handed:  Right  AIMS (if indicated):     Assets:  Others:  access to health care  ADL's:  Intact  Cognition: WNL  Sleep:  Number of Hours: 0   Risk to Self: Is patient at risk for suicide?: Yes Risk to Others:   Prior Inpatient Therapy:   Prior Outpatient Therapy:    Alcohol Screening: 1. How often do you have a drink containing alcohol?: Monthly or less 2. How many drinks containing alcohol do you have on a typical day when you are drinking?: 1 or 2 3. How often do you have six or more drinks on one occasion?: Never Preliminary Score:  0 4. How often during the last year have you found that you were not able to stop drinking once you had started?: Never 5. How often during the last year have you failed to do what was normally expected from you becasue of drinking?: Never 6. How often during the last year have you needed a first drink in the morning to get yourself going after a heavy drinking session?: Never 7. How often during the last year have you had a feeling of guilt of remorse after drinking?: Never 8. How often during the last year have you been unable to remember what happened the night before because you had been drinking?: Never 9. Have you or someone else been injured as a result of your drinking?: No 10. Has a relative or friend or a doctor or another health worker been concerned about your drinking or suggested you cut down?: No Alcohol Use Disorder Identification Test Final Score (AUDIT): 1 Brief Intervention: AUDIT score less than 7 or less-screening does not suggest unhealthy drinking-brief intervention not indicated  Allergies:   Allergies  Allergen Reactions  . Vancomycin Anaphylaxis  . Fish Allergy Other (See Comments)    Hot flash, dizziness   Lab Results:   Results for orders placed or performed during the hospital encounter of 03/12/15 (from the past 48 hour(s))  Comprehensive metabolic panel     Status: Abnormal   Collection Time: 03/12/15  9:44 AM  Result Value Ref Range   Sodium 137 135 - 145 mmol/L   Potassium 3.9 3.5 - 5.1 mmol/L   Chloride 107 101 - 111 mmol/L   CO2 24 22 - 32 mmol/L   Glucose, Bld 151 (H) 65 - 99 mg/dL   BUN 20 6 - 20 mg/dL   Creatinine, Ser 1.07 0.61 - 1.24 mg/dL   Calcium 8.9 8.9 - 10.3 mg/dL   Total Protein 7.9 6.5 - 8.1 g/dL   Albumin 3.9 3.5 - 5.0 g/dL   AST 86 (H) 15 - 41 U/L   ALT 50 17 - 63 U/L   Alkaline Phosphatase 163 (H) 38 - 126 U/L   Total Bilirubin 1.4 (H) 0.3 - 1.2 mg/dL   GFR calc non Af Amer >60 >60 mL/min   GFR calc Af Amer >60 >60 mL/min    Comment: (NOTE) The eGFR has been calculated using the CKD EPI equation. This calculation has not been validated in all clinical situations. eGFR's persistently <60 mL/min signify possible Chronic Kidney Disease.    Anion gap 6 5 - 15  Ethanol (ETOH)     Status: None   Collection Time: 03/12/15  9:44 AM  Result Value Ref Range   Alcohol, Ethyl (B) <5 <5 mg/dL    Comment:        LOWEST DETECTABLE LIMIT FOR SERUM ALCOHOL IS 5 mg/dL FOR MEDICAL PURPOSES ONLY   Salicylate level     Status: None   Collection Time: 03/12/15  9:44 AM  Result Value Ref Range   Salicylate Lvl <1.6 2.8 - 30.0 mg/dL  Acetaminophen level     Status: Abnormal   Collection Time: 03/12/15  9:44 AM  Result Value Ref Range   Acetaminophen (Tylenol), Serum <10 (L) 10 - 30 ug/mL    Comment:        THERAPEUTIC CONCENTRATIONS VARY SIGNIFICANTLY. A RANGE OF 10-30 ug/mL MAY BE AN EFFECTIVE CONCENTRATION FOR MANY PATIENTS. HOWEVER, SOME ARE BEST TREATED AT CONCENTRATIONS OUTSIDE THIS RANGE. ACETAMINOPHEN CONCENTRATIONS >150 ug/mL AT 4 HOURS AFTER INGESTION  AND >50 ug/mL AT 12 HOURS AFTER INGESTION ARE OFTEN ASSOCIATED WITH TOXIC REACTIONS.   CBC     Status: Abnormal    Collection Time: 03/12/15  9:44 AM  Result Value Ref Range   WBC 3.0 (L) 4.0 - 10.5 K/uL   RBC 4.11 (L) 4.22 - 5.81 MIL/uL   Hemoglobin 12.3 (L) 13.0 - 17.0 g/dL   HCT 36.6 (L) 39.0 - 52.0 %   MCV 89.1 78.0 - 100.0 fL   MCH 29.9 26.0 - 34.0 pg   MCHC 33.6 30.0 - 36.0 g/dL   RDW 14.8 11.5 - 15.5 %   Platelets 56 (L) 150 - 400 K/uL    Comment: SPECIMEN CHECKED FOR CLOTS PLATELET COUNT CONFIRMED BY SMEAR    Current Medications: Current Facility-Administered Medications  Medication Dose Route Frequency Provider Last Rate Last Dose  . acetaminophen (TYLENOL) tablet 650 mg  650 mg Oral Q6H PRN Evanna Burkett, NP      . alum & mag hydroxide-simeth (MAALOX/MYLANTA) 200-200-20 MG/5ML suspension 30 mL  30 mL Oral Q4H PRN Evanna Burkett, NP      . chlordiazePOXIDE (LIBRIUM) capsule 25 mg  25 mg Oral Q6H PRN Ursula Alert, MD      . hydrOXYzine (ATARAX/VISTARIL) tablet 50 mg  50 mg Oral Q6H PRN Evanna Burkett, NP      . ibuprofen (ADVIL,MOTRIN) tablet 800 mg  800 mg Oral TID PRN Evanna Burkett, NP      . magnesium hydroxide (MILK OF MAGNESIA) suspension 30 mL  30 mL Oral Daily PRN Evanna Burkett, NP      . OLANZapine zydis (ZYPREXA) disintegrating tablet 10 mg  10 mg Oral Q8H PRN Delfin Gant, NP      . risperiDONE (RISPERDAL M-TABS) disintegrating tablet 1 mg  1 mg Oral BID Delfin Gant, NP   1 mg at 03/13/15 0818   PTA Medications: Prescriptions prior to admission  Medication Sig Dispense Refill Last Dose  . amphetamine-dextroamphetamine (ADDERALL) 30 MG tablet Take 30 mg by mouth 2 (two) times daily.   03/12/2015  . clonazePAM (KLONOPIN) 1 MG tablet Take 1 mg by mouth 2 (two) times daily.   03/11/2015 at Unknown time  . ibuprofen (ADVIL,MOTRIN) 800 MG tablet Take 800 mg by mouth 3 (three) times daily as needed for headache, mild pain or moderate pain.   0 Past Week  . oxyCODONE-acetaminophen (PERCOCET) 7.5-325 MG per tablet Take 2 tablets by mouth 2 (two) times daily.     Lonie Peak Dihydrochloride 60 MG TABS Take 60 mg by mouth daily. (Patient not taking: Reported on 02/08/2015) 28 tablet 2 Unknown  . ribavirin (COPEGUS) 200 MG tablet 600 mg in am (Patient not taking: Reported on 02/08/2015) 84 tablet 2 Unknown  . Sofosbuvir 400 MG TABS Take 400 mg by mouth daily. (Patient not taking: Reported on 02/08/2015) 28 tablet 2 Unknown    Previous Psychotropic Medications: Yes - pt does not want to discuss his previous medications  Substance Abuse History in the last 12 months:  Yes.  cocaine , adderall, klonopin, opioids    Consequences of Substance Abuse: Medical Consequences:  recent admission Withdrawal Symptoms:   anxiety, agitation,restlessness  Results for orders placed or performed during the hospital encounter of 03/12/15 (from the past 72 hour(s))  Comprehensive metabolic panel     Status: Abnormal   Collection Time: 03/12/15  9:44 AM  Result Value Ref Range   Sodium 137 135 - 145 mmol/L   Potassium 3.9 3.5 -  5.1 mmol/L   Chloride 107 101 - 111 mmol/L   CO2 24 22 - 32 mmol/L   Glucose, Bld 151 (H) 65 - 99 mg/dL   BUN 20 6 - 20 mg/dL   Creatinine, Ser 1.07 0.61 - 1.24 mg/dL   Calcium 8.9 8.9 - 10.3 mg/dL   Total Protein 7.9 6.5 - 8.1 g/dL   Albumin 3.9 3.5 - 5.0 g/dL   AST 86 (H) 15 - 41 U/L   ALT 50 17 - 63 U/L   Alkaline Phosphatase 163 (H) 38 - 126 U/L   Total Bilirubin 1.4 (H) 0.3 - 1.2 mg/dL   GFR calc non Af Amer >60 >60 mL/min   GFR calc Af Amer >60 >60 mL/min    Comment: (NOTE) The eGFR has been calculated using the CKD EPI equation. This calculation has not been validated in all clinical situations. eGFR's persistently <60 mL/min signify possible Chronic Kidney Disease.    Anion gap 6 5 - 15  Ethanol (ETOH)     Status: None   Collection Time: 03/12/15  9:44 AM  Result Value Ref Range   Alcohol, Ethyl (B) <5 <5 mg/dL    Comment:        LOWEST DETECTABLE LIMIT FOR SERUM ALCOHOL IS 5 mg/dL FOR MEDICAL PURPOSES ONLY   Salicylate  level     Status: None   Collection Time: 03/12/15  9:44 AM  Result Value Ref Range   Salicylate Lvl <1.6 2.8 - 30.0 mg/dL  Acetaminophen level     Status: Abnormal   Collection Time: 03/12/15  9:44 AM  Result Value Ref Range   Acetaminophen (Tylenol), Serum <10 (L) 10 - 30 ug/mL    Comment:        THERAPEUTIC CONCENTRATIONS VARY SIGNIFICANTLY. A RANGE OF 10-30 ug/mL MAY BE AN EFFECTIVE CONCENTRATION FOR MANY PATIENTS. HOWEVER, SOME ARE BEST TREATED AT CONCENTRATIONS OUTSIDE THIS RANGE. ACETAMINOPHEN CONCENTRATIONS >150 ug/mL AT 4 HOURS AFTER INGESTION AND >50 ug/mL AT 12 HOURS AFTER INGESTION ARE OFTEN ASSOCIATED WITH TOXIC REACTIONS.   CBC     Status: Abnormal   Collection Time: 03/12/15  9:44 AM  Result Value Ref Range   WBC 3.0 (L) 4.0 - 10.5 K/uL   RBC 4.11 (L) 4.22 - 5.81 MIL/uL   Hemoglobin 12.3 (L) 13.0 - 17.0 g/dL   HCT 36.6 (L) 39.0 - 52.0 %   MCV 89.1 78.0 - 100.0 fL   MCH 29.9 26.0 - 34.0 pg   MCHC 33.6 30.0 - 36.0 g/dL   RDW 14.8 11.5 - 15.5 %   Platelets 56 (L) 150 - 400 K/uL    Comment: SPECIMEN CHECKED FOR CLOTS PLATELET COUNT CONFIRMED BY SMEAR     Observation Level/Precautions:  Detox Fall    Psychotherapy:  Individual and group therapy     Consultations:  Education officer, museum  Discharge Concerns: stability and safety     Other:     Psychological Evaluations: No   Treatment Plan Summary: Daily contact with patient to assess and evaluate symptoms and progress in treatment and Medication management   Patient will benefit from inpatient treatment and stabilization.  Estimated length of stay is 5-7 days.  Reviewed past medical records,treatment plan.  Per [place] controlled substance database review - pt is on Adderrall prescribed by out pt ptovider - most recently on 03/10/15- However will not restart Adderall 2/2 psychosis as well as his coexisting abuse hx. Pt with UDS also positive for BZD - will start CIWA protocol. Provide  Librium 25 mg prn for  withdrawal sx. Patient at this time is not open to discussions about medications - will reassess. Continue Risperidone 1 mg po bid for psychosis. Will continue to monitor vitals ,medication compliance and treatment side effects while patient is here.  Will monitor for medical issues as well as call consult as needed.  Reviewed labs  AST > ALT - cmp - pointing towards substance abuse , bilirubin -elevated - will get acute hepatitis panel - his CBC - has been chronically abnormal - per EHR - No acute interventions at this time. ,will order EKG . Will get TSH. CSW will start working on disposition.  Patient to participate in therapeutic milieu .       Medical Decision Making:  Review of Psycho-Social Stressors (1), Review or order clinical lab tests (1), Review and summation of old records (2), Established Problem, Worsening (2), Review of Last Therapy Session (1), Review of Medication Regimen & Side Effects (2) and Review of New Medication or Change in Dosage (2)  I certify that inpatient services furnished can reasonably be expected to improve the patient's condition.   Jezabelle Chisolm md 9/1/201611:00 AM

## 2015-03-14 ENCOUNTER — Encounter (HOSPITAL_COMMUNITY): Payer: Self-pay | Admitting: Registered Nurse

## 2015-03-14 DIAGNOSIS — F203 Undifferentiated schizophrenia: Secondary | ICD-10-CM | POA: Insufficient documentation

## 2015-03-14 MED ORDER — HYDROXYZINE HCL 50 MG PO TABS: 50.0000 mg | ORAL_TABLET | Freq: Four times a day (QID) | ORAL | Status: AC | PRN

## 2015-03-14 MED ORDER — RIBAVIRIN 200 MG PO TABS: ORAL_TABLET | ORAL | Status: AC

## 2015-03-14 MED ORDER — DACLATASVIR DIHYDROCHLORIDE 60 MG PO TABS: 60.0000 mg | ORAL_TABLET | Freq: Every day | ORAL | Status: AC

## 2015-03-14 MED ORDER — SOFOSBUVIR 400 MG PO TABS: 400.0000 mg | ORAL_TABLET | Freq: Every day | ORAL | Status: AC

## 2015-03-14 MED ORDER — RISPERIDONE 1 MG PO TBDP: 1.0000 mg | ORAL_TABLET | Freq: Two times a day (BID) | ORAL | Status: AC

## 2015-03-14 NOTE — Progress Notes (Signed)
Discharge Note:Discharge instructions/medications/follow up appointments discussed with pt. Prescriptions given,pt refused prescriptions. " I'm not taking this shit, I only need pain medications you wasted my time here.My hepatitis is under control. Patients belongings returned to pt.   Pt verbalizes understanding.  Pt denies SI/HI/AVH.

## 2015-03-14 NOTE — Discharge Summary (Signed)
Physician Discharge Summary Note  Patient:  Rodney Reed is an 49 y.o., male MRN:  [id number] DOB:  [birth year] Patient phone:  [phone] (home)  Patient address:   [address] [place] [address],  Total Time spent with patient: 45 minutes  Date of Admission:  03/12/2015 Date of Discharge: 03/14/2015  Reason for Admission:  Per H&P Note:  Rodney Reed is a 49 y.o. White male with history of Paranoid Schizophrenia. Per initial notes in EHR " Patient called 911 for help with his suicidal ideations that started several weeks ago , has a plan to cut his throat , has access to sharp objects."  Also per notes on 02/10/2015 - by Dr.Akintayo - " Rodney Reed is a 49 y.o. male patient does not want treatment without opiates, Adderall, and benzodiazepines.The patient became belligerent today when his opiates, Adderall, and Klonopin got discontinued. He started cursing, threatening, demanding, yelling, and not wanting to stay without these medications. Security was called and he was escorted off the unit per the psychiatrist's request. His mother was concerned and this provider talked with her along with the case manager later about substance abuse resources for her son. She reported he has been "banned" from [facility] and is not allowed back there. "Patient during that visit was provided with substance abuse resources .  Patient seen and chart reviewed.Discussed patient with treatment team. Patient when this writer initially attempted to evaluate patient - reported that he wanted the evaluation to be done in his room and not in writer's office since he was feeling tired. Writer hence agreed to do the evaluation in his room. Pt started talking about his schizophrenia , and being paranoid to the point that he could not walk out of his house anymore and also about AH. Pt reported AH asks him to kill self at times , but did not elaborate on it. However , when writer started asking  questions about his medications , and told the patient that Klonopin is not the treatment of choice for schizophrenia - pt got extremely agitated , started yelling , cursing and the techs on the unit had to come to the room inorder to make sure pt does not get more aggressive . Pt reported " Adderal, klonopin and opioids are the only medications that work for me , I want my medications, I do not want anything else". Pt thereafter did not want to cooperate with the evaluation.   Principal Problem: Schizophrenia Discharge Diagnoses: Patient Active Problem List   Diagnosis Date Noted  . Undifferentiated schizophrenia [F20.3]   . 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 Schizophrenia [F20.9] 03/12/2015  . Anxiety [F41.9] 02/08/2015  . 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 Hepatic cirrhosis [K74.60] 09/05/2014  . Hepatitis C virus infection without hepatic coma [B19.20] 10/29/2013  . Chronic back pain [M54.9, G89.29] 10/22/2013    Musculoskeletal: Strength & Muscle Tone: within normal limits Gait & Station: normal Patient leans: N/A  Psychiatric Specialty Exam:  See Suicide Risk Assessment Physical Exam  Nursing note and vitals reviewed. Constitutional: He is oriented to person, place, and time.  Neck: Normal range of motion.  Respiratory: Effort normal.  Musculoskeletal: Normal range of motion.  Neurological: He is alert and oriented to person, place, and time.    Review of Systems  Psychiatric/Behavioral: Negative for depression, suicidal ideas and hallucinations. The patient does not have insomnia.   All other systems reviewed and are negative.  Blood pressure 146/81, pulse 91, temperature 98.2 F (36.8 C), temperature source Oral, resp. rate 1, height 6' (1.829 m), weight 97.523 kg (215 lb), SpO2 98 %.Body mass index is 29.15 kg/(m^2).  Have you used any form of tobacco in the last 30 days? (Cigarettes,  Smokeless Tobacco, Cigars, and/or Pipes): Yes  Has this patient used any form of tobacco in the last 30 days? (Cigarettes, Smokeless Tobacco, Cigars, and/or Pipes) Yes, A prescription for an FDA-approved tobacco cessation medication was offered at discharge and the patient refused  Past Medical History:  Past Medical History  Diagnosis Date  . Back pain   . Cancer   . Cirrhosis   . Pancreatitis   . Schizophrenia, paranoid   . Hepatitis C   . Anxiety     Past Surgical History  Procedure Laterality Date  . Tonsillectomy Bilateral as infant   Family History:  Family History  Problem Relation Age of Onset  . Hypertension Mother   . Diabetes Mother    Social History:  History  Alcohol Use  . 1.2 oz/week  . 2 Cans of beer per week    Comment: occasionally     History  Drug Use No    Comment:  hx of heroin use; sts former use    Social History   Social History  . Marital Status: Legally Separated    Spouse Name: N/A  . Number of Children: N/A  . Years of Education: N/A   Social History Main Topics  . Smoking status: Current Every Day Smoker -- 0.50 packs/day for 33 years    Types: Cigarettes  . Smokeless tobacco: None     Comment: trying to cut back  . Alcohol Use: 1.2 oz/week    2 Cans of beer per week     Comment: occasionally  . Drug Use: No     Comment:  hx of heroin use; sts former use  . Sexual Activity: Not Currently   Other Topics Concern  . None   Social History Narrative   Risk to Self: Is patient at risk for suicide?: Yes Risk to Others:   Prior Inpatient Therapy:   Prior Outpatient Therapy:    Level of Care:  OP  Hospital Course:  Rodney Reed was admitted for Schizophrenia and crisis management.  He was treated discharged with the medications listed below under Medication List.  Medical problems were identified and treated as needed.  Home medications were restarted as appropriate.  Improvement was monitored by observation and Rodney Reed  daily report of symptom reduction.  Emotional and mental status was monitored by daily self-inventory reports completed by Rodney Reed and clinical staff.         Rodney Reed was evaluated by the treatment team for stability and plans for continued recovery upon discharge.  Rodney Reed motivation was an integral factor for scheduling further treatment.  Employment, transportation, bed availability, health status, family support, and any pending legal issues were also considered during his hospital stay.  He was offered further treatment options upon discharge including but not limited to Residential, Intensive Outpatient, and Outpatient treatment.  Rodney Reed will follow up with the services as listed below under Follow Up Information.     Upon completion of this admission the patient was both mentally and medically stable for discharge denying suicidal/homicidal ideation, auditory/visual/tactile hallucinations, delusional thoughts and paranoia.      Consults:  psychiatry  Significant Diagnostic Studies:  labs: Reviewed  Discharge Vitals:   Blood  pressure 146/81, pulse 91, temperature 98.2 F (36.8 C), temperature source Oral, resp. rate 1, height 6' (1.829 m), weight 97.523 kg (215 lb), SpO2 98 %. Body mass index is 29.15 kg/(m^2). Lab Results:   Results for orders placed or performed during the hospital encounter of 03/12/15 (from the past 72 hour(s))  Comprehensive metabolic panel     Status: Abnormal   Collection Time: 03/12/15  9:44 AM  Result Value Ref Range   Sodium 137 135 - 145 mmol/L   Potassium 3.9 3.5 - 5.1 mmol/L   Chloride 107 101 - 111 mmol/L   CO2 24 22 - 32 mmol/L   Glucose, Bld 151 (H) 65 - 99 mg/dL   BUN 20 6 - 20 mg/dL   Creatinine, Ser 1.07 0.61 - 1.24 mg/dL   Calcium 8.9 8.9 - 10.3 mg/dL   Total Protein 7.9 6.5 - 8.1 g/dL   Albumin 3.9 3.5 - 5.0 g/dL   AST 86 (H) 15 - 41 U/L   ALT 50 17 - 63 U/L   Alkaline Phosphatase 163 (H) 38 - 126 U/L   Total Bilirubin  1.4 (H) 0.3 - 1.2 mg/dL   GFR calc non Af Amer >60 >60 mL/min   GFR calc Af Amer >60 >60 mL/min    Comment: (NOTE) The eGFR has been calculated using the CKD EPI equation. This calculation has not been validated in all clinical situations. eGFR's persistently <60 mL/min signify possible Chronic Kidney Disease.    Anion gap 6 5 - 15  Ethanol (ETOH)     Status: None   Collection Time: 03/12/15  9:44 AM  Result Value Ref Range   Alcohol, Ethyl (B) <5 <5 mg/dL    Comment:        LOWEST DETECTABLE LIMIT FOR SERUM ALCOHOL IS 5 mg/dL FOR MEDICAL PURPOSES ONLY   Salicylate level     Status: None   Collection Time: 03/12/15  9:44 AM  Result Value Ref Range   Salicylate Lvl <5.3 2.8 - 30.0 mg/dL  Acetaminophen level     Status: Abnormal   Collection Time: 03/12/15  9:44 AM  Result Value Ref Range   Acetaminophen (Tylenol), Serum <10 (L) 10 - 30 ug/mL    Comment:        THERAPEUTIC CONCENTRATIONS VARY SIGNIFICANTLY. A RANGE OF 10-30 ug/mL MAY BE AN EFFECTIVE CONCENTRATION FOR MANY PATIENTS. HOWEVER, SOME ARE BEST TREATED AT CONCENTRATIONS OUTSIDE THIS RANGE. ACETAMINOPHEN CONCENTRATIONS >150 ug/mL AT 4 HOURS AFTER INGESTION AND >50 ug/mL AT 12 HOURS AFTER INGESTION ARE OFTEN ASSOCIATED WITH TOXIC REACTIONS.   CBC     Status: Abnormal   Collection Time: 03/12/15  9:44 AM  Result Value Ref Range   WBC 3.0 (L) 4.0 - 10.5 K/uL   RBC 4.11 (L) 4.22 - 5.81 MIL/uL   Hemoglobin 12.3 (L) 13.0 - 17.0 g/dL   HCT 36.6 (L) 39.0 - 52.0 %   MCV 89.1 78.0 - 100.0 fL   MCH 29.9 26.0 - 34.0 pg   MCHC 33.6 30.0 - 36.0 g/dL   RDW 14.8 11.5 - 15.5 %   Platelets 56 (L) 150 - 400 K/uL    Comment: SPECIMEN CHECKED FOR CLOTS PLATELET COUNT CONFIRMED BY SMEAR     Physical Findings: AIMS: Facial and Oral Movements Muscles of Facial Expression: None, normal Lips and Perioral Area: None, normal Jaw: None, normal Tongue: None, normal,Extremity Movements Upper (arms, wrists, hands, fingers):  None, normal Lower (legs, knees, ankles, toes): None, normal, Trunk Movements  Neck, shoulders, hips: None, normal, Overall Severity Severity of abnormal movements (highest score from questions above): None, normal Incapacitation due to abnormal movements: None, normal Patient's awareness of abnormal movements (rate only patient's report): No Awareness, Dental Status Current problems with teeth and/or dentures?: No Does patient usually wear dentures?: No  CIWA:  CIWA-Ar Total: 7 COWS:  COWS Total Score: 4   See Psychiatric Specialty Exam and Suicide Risk Assessment completed by Attending Physician prior to discharge.  Discharge destination:  Home  Is patient on multiple antipsychotic therapies at discharge:  Yes,   Do you recommend tapering to monotherapy for antipsychotics?  No   Has Patient had three or more failed trials of antipsychotic monotherapy by history:  No    Recommended Plan for Multiple Antipsychotic Therapies: Additional reason(s) for multiple antispychotic treatment:  Patient stabilization     Medication List    STOP taking these medications        amphetamine-dextroamphetamine 30 MG tablet  Commonly known as:  ADDERALL     clonazePAM 1 MG tablet  Commonly known as:  KLONOPIN     ibuprofen 800 MG tablet  Commonly known as:  ADVIL,MOTRIN     oxyCODONE-acetaminophen 7.5-325 MG per tablet  Commonly known as:  PERCOCET      TAKE these medications      Indication   Daclatasvir Dihydrochloride 60 MG Tabs  Take 60 mg by mouth daily.   Indication:  Chronic Hepatitis C     hydrOXYzine 50 MG tablet  Commonly known as:  ATARAX/VISTARIL  Take 1 tablet (50 mg total) by mouth every 6 (six) hours as needed for anxiety.   Indication:  Anxiety Neurosis     ribavirin 200 MG tablet  Commonly known as:  COPEGUS  600 mg in am   Indication:  Chronic Hepatitis C     risperiDONE 1 MG disintegrating tablet  Commonly known as:  RISPERDAL M-TABS  Take 1 tablet (1 mg  total) by mouth 2 (two) times daily.   Indication:  Schizophrenia     Sofosbuvir 400 MG Tabs  Take 400 mg by mouth daily.   Indication:  Chronic Hepatitis C       Follow-up Information    Follow up with Pt states he will follow up at [facility] and with his pain management Dr.  He declined to sign a release for either one..      Follow-up recommendations:  Activity:  As tolerated Diet:  As tolerated  Comments:   Patient has been instructed to take medications as prescribed; and report adverse effects to outpatient provider.  Follow up with primary doctor for any medical issues and If symptoms recur report to nearest emergency or crisis hot line.    Total Discharge Time: 45 minutes  Signed: Earleen Newport, FNP-BC 03/14/2015, 4:35 PM

## 2015-03-14 NOTE — Plan of Care (Signed)
Problem: Diagnosis: Increased Risk For Suicide Attempt Goal: STG-Patient Will Report Suicidal Feelings to Staff Outcome: Progressing Pt reports he is not having any suicidal thoughts at this time, and wants to be discharged on Friday.

## 2015-03-14 NOTE — Progress Notes (Signed)
  [facility] Adult Case Management Discharge Plan :  Will you be returning to the same living situation after discharge:  Yes,  home with mother At discharge, do you have transportation home?: Yes,  bus pass Do you have the ability to pay for your medications: Yes,  MCD  Release of information consent forms completed and in the chart;  Patient's signature needed at discharge.  Patient to Follow up at: Follow-up Information    Follow up with Pt states he will follow up at [facility] and with his pain management Dr.  He declined to sign a release for either one..      Patient denies SI/HI: Yes,  yes    Safety Planning and Suicide Prevention discussed: Yes,  yes  Have you used any form of tobacco in the last 30 days? (Cigarettes, Smokeless Tobacco, Cigars, and/or Pipes): Yes  Has patient been referred to the Quitline?: Patient refused referral  Trish Mage 03/14/2015, 11:53 AM

## 2015-03-14 NOTE — [facility] Suicide Risk Assessment (Signed)
[facility] Discharge Suicide Risk Assessment   Demographic Factors:  Male and Caucasian  Total Time spent with patient: 30 minutes  Musculoskeletal: Strength & Muscle Tone: within normal limits Gait & Station: normal Patient leans: N/A  Psychiatric Specialty Exam: Physical Exam  Review of Systems  Musculoskeletal: Positive for back pain.  Psychiatric/Behavioral: Positive for substance abuse. Negative for suicidal ideas and hallucinations.  All other systems reviewed and are negative.   Blood pressure 146/81, pulse 91, temperature 98.2 F (36.8 C), temperature source Oral, resp. rate 1, height 6' (1.829 m), weight 97.523 kg (215 lb), SpO2 98 %.Body mass index is 29.15 kg/(m^2).  General Appearance: Casual  Eye Contact::  Minimal  Speech:  Clear and Coherent409  Volume:  Normal  Mood:  Euthymic  Affect:  Appropriate  Thought Process:  Goal Directed  Orientation:  Full (Time, Place, and Person)  Thought Content:  WDL  Suicidal Thoughts:  No  Homicidal Thoughts:  No  Memory:  Immediate;   Fair Recent;   Fair Remote;   Fair  Judgement:  Impaired  Insight:  Shallow  Psychomotor Activity:  Normal  Concentration:  Fair  Recall:  [place]  Language: Fair  Akathisia:  No  Handed:  Right  AIMS (if indicated):     Assets:  Communication Skills Desire for Improvement  Sleep:  Number of Hours: 1.5  Cognition: WNL  ADL's:  Intact   Have you used any form of tobacco in the last 30 days? (Cigarettes, Smokeless Tobacco, Cigars, and/or Pipes): Yes  Has this patient used any form of tobacco in the last 30 days? (Cigarettes, Smokeless Tobacco, Cigars, and/or Pipes) Yes, A prescription for an FDA-approved tobacco cessation medication was offered at discharge and the patient refused  Mental Status Per Nursing Assessment::   On Admission:  Self-harm thoughts  Current Mental Status by Physician: Patient at this time asking for discharge , does not want to participate in  any treatment process other than being on klonopin/adderrall and pain medications.Pt currently denies SI/HI/AH/VH  Loss Factors: Decline in physical health  Historical Factors: Impulsivity  Risk Reduction Factors:   Positive social support  Continued Clinical Symptoms:  Alcohol/Substance Abuse/Dependencies Unstable or Poor Therapeutic Relationship Previous Psychiatric Diagnoses and Treatments  Cognitive Features That Contribute To Risk:  Closed-mindedness, Polarized thinking and Thought constriction (tunnel vision)    Suicide Risk:  Minimal: No identifiable suicidal ideation.  Patients presenting with no risk factors but with morbid ruminations; may be classified as minimal risk based on the severity of the depressive symptoms  Principal Problem: Schizophrenia Discharge Diagnoses:  Patient Active Problem List   Diagnosis Date Noted  . 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 Schizophrenia [F20.9] 03/12/2015  . Anxiety [F41.9] 02/08/2015  . 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 Hepatic cirrhosis [K74.60] 09/05/2014  . Hepatitis C virus infection without hepatic coma [B19.20] 10/29/2013  . Chronic back pain [M54.9, G89.29] 10/22/2013      Plan Of Care/Follow-up recommendations:  Activity:  No restrictions Diet:  regular Tests:  as needed Other:  follow up with after care  Is patient on multiple antipsychotic therapies at discharge:  No   Has Patient had three or more failed trials of antipsychotic monotherapy by history:  No  Recommended Plan for Multiple Antipsychotic Therapies: NA    Kodie Kishi md 03/14/2015, 10:02 AM

## 2015-03-14 NOTE — Plan of Care (Signed)
Problem: Alteration in thought process Goal: LTG-Patient has not harmed self or others in at least 2 days Outcome: Progressing Pt has been able to stay in control of his behaviors and has not harmed himself or anyone else on the unit.

## 2015-03-14 NOTE — Tx Team (Signed)
Interdisciplinary Treatment Plan Update (Adult)  Date:  03/14/2015   Time Reviewed:  11:44 AM   Progress in Treatment: Attending groups: No Participating in groups:  No Taking medication as prescribed:  Yes. Tolerating medication:  Yes. Family/Significant othe contact made: Yes  Patient understands diagnosis: No  Limited insight.  Insists he needs his outpt meds, all controlled substances.  Was positive for cocaine at admission Discussing patient identified problems/goals with staff:  Yes, see initial care plan. Medical problems stabilized or resolved:  Yes. Denies suicidal/homicidal ideation: Yes. Issues/concerns per patient self-inventory:  No. Other:  New problem(s) identified:  Discharge Plan or Barriers: see below  Reason for Continuation of Hospitalization:   Comments:  Rodney Reed is a 49 y.o. male patient does not want treatment without opiates, Adderall, and benzodiazepines.   The patient became belligerent today when his opiates, Adderall, and Klonopin got discontinued. He started cursing, threatening, demanding, yelling, and not wanting to stay without these medications. Security was called and he was escorted off the unit per the psychiatrist's request. His mother was concerned and this provider talked with her along with the case manager later about substance abuse resources for her son. She reported he has been "banned" from [facility] and is not allowed back there.  The above note was recorded by Dr Darleene Cleaver in the ED on 02/10/15.  Risperdal trial   Estimated length of stay: D/C today  New goal(s):  Review of initial/current patient goals per problem list:   Review of initial/current patient goals per problem list:  1. Goal(s): Patient will participate in aftercare plan   Met: Yes   Target date: 3-5 days post admission date   As evidenced by: Patient will participate within aftercare plan AEB aftercare provider and housing plan at discharge  being identified. 03/13/15  Pt unwilling to engage in conversation re: dispositional plan 03/14/2015 Pt plans to return home, follow up outpt.  Pt refused to sign release of outpt providers    2. Goal (s): Patient will exhibit decreased depressive symptoms and suicidal ideations.   Met: Yes   Target date: 3-5 days post admission date   As evidenced by: Patient will utilize self rating of depression at 3 or below and demonstrate decreased signs of depression or be deemed stable for discharge by MD. 03/13/15  Pt was c/o SI at admission.  Unable to rate now as he is not willing to answer questions about SI and depression. 03/14/2015 Pt denies SI, depression    4. Goal(s): Patient will demonstrate decreased signs of withdrawal due to substance abuse   Met: Yes   Target date: 3-5 days post admission date   As evidenced by: Patient will produce a CIWA/COWS score of 0, have stable vitals signs, and no symptoms of withdrawal 03/13/2015   Pt has COWS score of 10.  Refusing librium "because it is not what I take at home." 03/14/2015 CIWA score of 7.  "It's OK.  I'll go home and take my meds that help me, and I'll be fine."   5. Goal(s): Patient will demonstrate decreased signs of psychosis  * Met: Yes  * Target date: 3-5 days post admission date  * As evidenced by: Patient will demonstrate decreased frequency of AVH or return to baseline function 03/13/2015  Pt exhibiting what was described as paranoia prior to admission to [facility].  Not taking any oupt meds to address this symptom 03/14/2015 Pt denies all symptoms today  Attendees: Patient:  03/14/2015 11:44 AM   Family:   03/14/2015 11:44 AM   Physician:  Ursula Alert, MD 03/14/2015 11:44 AM   Nursing:   Marcella Dubs, RN 03/14/2015 11:44 AM   CSW:    Roque Lias, LCSW   03/14/2015 11:44 AM   Other:  03/14/2015 11:44 AM   Other:   03/14/2015 11:44 AM   Other:  Lars Pinks, Nurse CM 03/14/2015 11:44 AM   Other:  Lucinda Dell, [facility]  TCT 03/14/2015 11:44 AM   Other:  Norberto Sorenson, [place]  03/14/2015 11:44 AM   Other:  03/14/2015 11:44 AM   Other:  03/14/2015 11:44 AM   Other:  03/14/2015 11:44 AM   Other:  03/14/2015 11:44 AM   Other:  03/14/2015 11:44 AM   Other:   03/14/2015 11:44 AM    Scribe for Treatment Team:   Trish Mage, 03/14/2015 11:44 AM

## 2015-03-14 NOTE — Clinical Social Work Note (Signed)
Pt not cooperative for interview for PSA.  Left within 48 hours of admission.

## 2015-03-14 NOTE — [facility] Suicide Risk Assessment (Signed)
Rodney Reed INPATIENT:  Family/Significant Other Suicide Prevention Education  Suicide Prevention Education:  Education Completed; [name], mother, 21 [id number] has been identified by the patient as the family member/significant other with whom the patient will be residing, and identified as the person(s) who will aid the patient in the event of a mental health crisis (suicidal ideations/suicide attempt).  With written consent from the patient, the family member/significant other has been provided the following suicide prevention education, prior to the and/or following the discharge of the patient.  The suicide prevention education provided includes the following:  Suicide risk factors  Suicide prevention and interventions  National Suicide Hotline telephone number  [facility] assessment telephone number  [facility] Emergency Assistance [place] and/or Residential Mobile Crisis Unit telephone number  Request made of family/significant other to:  Remove weapons (e.g., guns, rifles, knives), all items previously/currently identified as safety concern.    Remove drugs/medications (over-the-counter, prescriptions, illicit drugs), all items previously/currently identified as a safety concern.  The family member/significant other verbalizes understanding of the suicide prevention education information provided.  The family member/significant other agrees to remove the items of safety concern listed above.  Rodney Reed 03/14/2015, 11:50 AM

## 2015-03-14 NOTE — Progress Notes (Signed)
Pt has been awake most of the night.  While he did take some meds earlier in the evening, he would not take anything else.  He is still angry that he is not getting the percocet for his back pain.  He came out of his room around midnight and paced the hall, but after that he went back to his room.

## 2015-03-28 NOTE — Telephone Encounter (Signed)
Patient calle Dr Megan Salon asking about appt for liver biopsy at [facility]. Advised will research to find out what is going on with this as it is not immediately clear what is going on.

## 2015-04-30 ENCOUNTER — Telehealth: Payer: Self-pay | Admitting: *Deleted

## 2015-04-30 NOTE — Telephone Encounter (Signed)
You can schedule him with me at my next available appt (January or so) and we can discuss options, get labs, etc...  He missed appts with me already.  If he is about to lose his insurance, then a referral somewhere likely won't help much and we will have to get him through the assistance program.   thanks

## 2015-04-30 NOTE — Telephone Encounter (Signed)
Patient called, stating that [facility] wouldn't do the liver biopsy and they refuse to accept him as a patient. He has been denied by [facility] due to no-shows.  He is concerned, does not know what to do next.   Patient states he is insured (Medicaid) but is unsure how much longer he will have this coverage. Please advise.  Landis Gandy, RN

## 2015-05-01 NOTE — Telephone Encounter (Signed)
Patient's mother called back and was upset that the patient could not be seen sooner than January. She said that she was present at his last office visit when Dr. Linus Salmons said that he could possibly have cancer. I explained that would be confirmed with a biopsy and she said that [place] has been "black balled" from [facility]. I told her I did not know what that meant and she repeated, "you know, black balled". She said he was an established patient and due to the direness of his condition he should be seen ASAP. Explained that is the next available with Dr. Linus Salmons for even established patients. She accepted the appt and I gave her the phone # for [facility] and she is going to try and get a sooner appt there. Myrtis Hopping

## 2015-05-01 NOTE — Telephone Encounter (Signed)
Called and left patient a voice mail with Dr. Henreitta Leber response about scheduling a follow up appointment with him.

## 2015-06-28 ENCOUNTER — Encounter (HOSPITAL_COMMUNITY): Payer: Self-pay

## 2015-06-28 ENCOUNTER — Emergency Department (HOSPITAL_COMMUNITY)
Admission: EM | Admit: 2015-06-28 | Discharge: 2015-06-28 | Disposition: A | Discharge status: Home / Self Care | Payer: Medicaid Other | Attending: Emergency Medicine | Admitting: Emergency Medicine

## 2015-06-28 ENCOUNTER — Emergency Department (HOSPITAL_COMMUNITY): Payer: Medicaid Other

## 2015-06-28 DIAGNOSIS — C220 Liver cell carcinoma: Secondary | ICD-10-CM | POA: Diagnosis not present

## 2015-06-28 DIAGNOSIS — R180 Malignant ascites: Secondary | ICD-10-CM | POA: Insufficient documentation

## 2015-06-28 DIAGNOSIS — R109 Unspecified abdominal pain: Secondary | ICD-10-CM | POA: Diagnosis present

## 2015-06-28 DIAGNOSIS — Z79899 Other long term (current) drug therapy: Secondary | ICD-10-CM | POA: Diagnosis not present

## 2015-06-28 DIAGNOSIS — F209 Schizophrenia, unspecified: Secondary | ICD-10-CM | POA: Insufficient documentation

## 2015-06-28 DIAGNOSIS — Z8719 Personal history of other diseases of the digestive system: Secondary | ICD-10-CM | POA: Diagnosis not present

## 2015-06-28 DIAGNOSIS — F419 Anxiety disorder, unspecified: Secondary | ICD-10-CM | POA: Diagnosis not present

## 2015-06-28 DIAGNOSIS — F17210 Nicotine dependence, cigarettes, uncomplicated: Secondary | ICD-10-CM | POA: Insufficient documentation

## 2015-06-28 DIAGNOSIS — R188 Other ascites: Secondary | ICD-10-CM

## 2015-06-28 DIAGNOSIS — Z8619 Personal history of other infectious and parasitic diseases: Secondary | ICD-10-CM | POA: Diagnosis not present

## 2015-06-28 HISTORY — DX: Malignant neoplasm of liver, not specified as primary or secondary: C22.9

## 2015-06-28 LAB — ALBUMIN, FLUID (OTHER)

## 2015-06-28 LAB — COMPREHENSIVE METABOLIC PANEL
ALT: 105 U/L — ABNORMAL HIGH (ref 17–63)
AST: 96 U/L — AB (ref 15–41)
Albumin: 2.8 g/dL — ABNORMAL LOW (ref 3.5–5.0)
Alkaline Phosphatase: 129 U/L — ABNORMAL HIGH (ref 38–126)
Anion gap: 8 (ref 5–15)
BUN: 17 mg/dL (ref 6–20)
CHLORIDE: 103 mmol/L (ref 101–111)
CO2: 26 mmol/L (ref 22–32)
Calcium: 8.5 mg/dL — ABNORMAL LOW (ref 8.9–10.3)
Creatinine, Ser: 0.94 mg/dL (ref 0.61–1.24)
Glucose, Bld: 188 mg/dL — ABNORMAL HIGH (ref 65–99)
POTASSIUM: 3.6 mmol/L (ref 3.5–5.1)
Sodium: 137 mmol/L (ref 135–145)
Total Bilirubin: 1.1 mg/dL (ref 0.3–1.2)
Total Protein: 6 g/dL — ABNORMAL LOW (ref 6.5–8.1)

## 2015-06-28 LAB — CBC WITH DIFFERENTIAL/PLATELET
Basophils Absolute: 0 10*3/uL (ref 0.0–0.1)
Basophils Relative: 0 %
EOS PCT: 3 %
Eosinophils Absolute: 0.2 10*3/uL (ref 0.0–0.7)
HCT: 35.5 % — ABNORMAL LOW (ref 39.0–52.0)
HEMOGLOBIN: 11.5 g/dL — AB (ref 13.0–17.0)
LYMPHS ABS: 0.8 10*3/uL (ref 0.7–4.0)
LYMPHS PCT: 14 %
MCH: 29.5 pg (ref 26.0–34.0)
MCHC: 32.4 g/dL (ref 30.0–36.0)
MCV: 91 fL (ref 78.0–100.0)
MONOS PCT: 12 %
Monocytes Absolute: 0.7 10*3/uL (ref 0.1–1.0)
NEUTROS PCT: 70 %
Neutro Abs: 3.9 10*3/uL (ref 1.7–7.7)
Platelets: 62 10*3/uL — ABNORMAL LOW (ref 150–400)
RBC: 3.9 MIL/uL — AB (ref 4.22–5.81)
RDW: 14.5 % (ref 11.5–15.5)
WBC: 5.5 10*3/uL (ref 4.0–10.5)

## 2015-06-28 LAB — GLUCOSE, PERITONEAL FLUID: Glucose, Peritoneal Fluid: 130 mg/dL

## 2015-06-28 LAB — PROTEIN, BODY FLUID: Total protein, fluid: <3 g/dL

## 2015-06-28 LAB — PROTIME-INR
INR: 1.31 (ref 0.00–1.49)
PROTHROMBIN TIME: 16.4 s — AB (ref 11.6–15.2)

## 2015-06-28 LAB — LACTATE DEHYDROGENASE, PLEURAL OR PERITONEAL FLUID: LD FL: 22 U/L (ref 3–23)

## 2015-06-28 MED ORDER — HYDROMORPHONE HCL 1 MG/ML IJ SOLN
1.0000 mg | Freq: Once | INTRAMUSCULAR | Status: AC
  Administered 2015-06-28: 1 mg via INTRAVENOUS
  Filled 2015-06-28: qty 1

## 2015-06-28 NOTE — ED Notes (Signed)
Pt. Stood up on the side of the bed. Pt. Was unsteady and couldn't walk. Pt. Had two assist back to the bed. Nurse aware.

## 2015-06-28 NOTE — ED Notes (Signed)
EMS v/s were bp 146/76 Pulse 88 rr 18 CBG 150 SpO2 98 room air    [facility]  Mother in route with hospice paper work   Ax, to vanco  Hx liver cancer    C/o distended abdomen  Here in Ed for ------- drainage per hospice nurse request

## 2015-06-28 NOTE — ED Notes (Signed)
Pt comes to Ed via EMS, c/o of distended abdomen, and pain. Ems reports distended abdomen for past week, and was sent hospice nurse to EMS/ED for drainage. Pt has a Hx of liver cancer, and back sciatica nerve pain. Ems reports sciatica and nerve pressure pain that's affecting pts pain tolerance. Pt belongs to [facility], and  pt's mother is en route with the hospice paper work. Pt has known allergy to vancomycin.

## 2015-06-28 NOTE — ED Notes (Signed)
He remains in no distress and is taken to u/s at this time.  His mother is with him also.

## 2015-06-28 NOTE — ED Notes (Signed)
He is very drowsy, as is one who has taken sedating medication(s).  This notwithstanding, he asks for "pain medicine".  He failed at attempt to ambulate, acting as if he would faint.  Dr. Stark Jock notified.  He remains in no distress.

## 2015-06-28 NOTE — ED Notes (Signed)
He has gotten himself up and is ambulatory in rm.  Per pt. Request I let Dr. Stark Jock know pt. Is asking for something for pain.  His mother remains with him.  Dr. Stark Jock plan is to allow pt. To take his already prescribed home pain meds.  His abd. bandaide remains dry and intact.

## 2015-06-28 NOTE — Discharge Instructions (Signed)
Continue your medications as before.  Follow-up with your primary care providers in the next few days.   Ascites Ascites is a collection of excess fluid in the abdomen. Ascites can range from mild to severe. It can get worse without treatment. CAUSES Possible causes include:  Cirrhosis. This is the most common cause of ascites.  Infection or inflammation in the abdomen.  Cancer in the abdomen.  Heart failure.  Kidney disease.  Inflammation of the pancreas.  Clots in the veins of the liver. SIGNS AND SYMPTOMS Signs and symptoms may include:  A feeling of fullness in your abdomen. This is common.  An increase in the size of your abdomen or your waist.  Swelling in your legs.  Swelling of the scrotum in men.  Difficulty breathing.  Abdominal pain.  Sudden weight gain. If the condition is mild, you may not have symptoms. DIAGNOSIS To make a diagnosis, your health care provider will:  Ask about your medical history.  Perform a physical exam.  Order imaging tests, such as an ultrasound or CT scan of your abdomen. TREATMENT Treatment depends on the cause of the ascites. It may include:  Taking a pill to make you urinate. This is called a water pill (diuretic pill).  Strictly reducing your salt (sodium) intake. Salt can cause extra fluid to be kept in the body, and this makes ascites worse.  Having a procedure to remove fluid from your abdomen (paracentesis).  Having a procedure to transfer fluid from your abdomen into a vein.  Having a procedure that connects two of the major veins within your liver and relieves pressure on your liver (TIPS procedure). Ascites may go away or improve with treatment of the condition that caused it.  HOME CARE INSTRUCTIONS  Keep track of your weight. To do this, weigh yourself at the same time every day and record your weight.  Keep track of how much you drink and any changes in the amount you urinate.  Follow any instructions  that your health care provider gives you about how much to drink.  Try not to eat salty (high-sodium) foods.  Take medicines only as directed by your health care provider.  Keep all follow-up visits as directed by your health care provider. This is important.  Report any changes in your health to your health care provider, especially if you develop new symptoms or your symptoms get worse. SEEK MEDICAL CARE IF:  Your gain more than 3 pounds in 3 days.  Your abdominal size or your waist size increases.  You have new swelling in your legs.  The swelling in your legs gets worse. SEEK IMMEDIATE MEDICAL CARE IF:  You develop a fever.  You develop confusion.  You develop new or worsening difficulty breathing.  You develop new or worsening abdominal pain.  You develop new or worsening swelling in the scrotum (in men).   This information is not intended to replace advice given to you by your health care provider. Make sure you discuss any questions you have with your health care provider.   Document Released: 06/28/2005 Document Revised: 07/19/2014 Document Reviewed: 01/25/2014 Elsevier Interactive Patient Education [employer].

## 2015-06-28 NOTE — ED Notes (Signed)
Nurse drawing labs. 

## 2015-06-28 NOTE — ED Notes (Signed)
He tells [place] he has recurrent liver cancer and is currently in hospice care.  He states he has been told by a provider that he "have only about 6 weeks to live".  His c/o today is of his abd. Girth beginning to inhibit his ability to breath and to walk and he is requesting paracentesis.  He is in no distress.

## 2015-06-28 NOTE — ED Provider Notes (Signed)
CSN: [id number]     Arrival date & time 06/28/15  1057 History   First MD Initiated Contact with Patient 06/28/15 1103     No chief complaint on file.    (Consider location/radiation/quality/duration/timing/severity/associated sxs/prior Treatment) HPI Comments: Patient is a 49 year old male with history of hepatocellular carcinoma and schizophrenia. He presents today for evaluation of increased abdominal discomfort and swelling. He was told to come here for possible paracentesis. He denies any fevers or chills. He denies any vomiting or change in his bowel habits.  The history is provided by the patient.    Past Medical History  Diagnosis Date  . Back pain   . Cancer   . Cirrhosis   . Pancreatitis   . Schizophrenia, paranoid   . Hepatitis C   . Anxiety    Past Surgical History  Procedure Laterality Date  . Tonsillectomy Bilateral as infant   Family History  Problem Relation Age of Onset  . Hypertension Mother   . Diabetes Mother    Social History  Substance Use Topics  . Smoking status: Current Every Day Smoker -- 0.50 packs/day for 33 years    Types: Cigarettes  . Smokeless tobacco: Not on file     Comment: trying to cut back  . Alcohol Use: 1.2 oz/week    2 Cans of beer per week     Comment: occasionally    Review of Systems  All other systems reviewed and are negative.     Allergies  Vancomycin and Fish allergy  Home Medications   Prior to Admission medications   Medication Sig Start Date End Date Taking? Authorizing Provider  Daclatasvir Dihydrochloride 60 MG TABS Take 60 mg by mouth daily. 03/14/15   Kerrie Buffalo, NP  hydrOXYzine (ATARAX/VISTARIL) 50 MG tablet Take 1 tablet (50 mg total) by mouth every 6 (six) hours as needed for anxiety. 03/14/15   Kerrie Buffalo, NP  ribavirin (COPEGUS) 200 MG tablet 600 mg in am 03/14/15   Kerrie Buffalo, NP  risperiDONE (RISPERDAL M-TABS) 1 MG disintegrating tablet Take 1 tablet (1 mg total) by mouth 2 (two) times  daily. 03/14/15   Kerrie Buffalo, NP  Sofosbuvir 400 MG TABS Take 400 mg by mouth daily. 03/14/15   Kerrie Buffalo, NP   BP 145/69 mmHg  Pulse 91  Temp(Src) 97.9 F (36.6 C) (Oral)  SpO2 98% Physical Exam  Constitutional: He is oriented to person, place, and time. He appears well-developed and well-nourished. No distress.  HENT:  Head: Normocephalic and atraumatic.  Neck: Normal range of motion. Neck supple.  Cardiovascular: Normal rate, regular rhythm and normal heart sounds.   No murmur heard. Pulmonary/Chest: Effort normal and breath sounds normal. No respiratory distress. He has no wheezes.  Abdominal: Soft. Bowel sounds are normal. He exhibits distension. There is tenderness. There is no rebound and no guarding.  There is generalized distention of the abdomen. There is a fluid wave present.  Musculoskeletal: Normal range of motion. He exhibits no edema.  Neurological: He is alert and oriented to person, place, and time.  Skin: Skin is warm and dry. He is not diaphoretic.  Nursing note and vitals reviewed.   ED Course  Procedures (including critical care time) Labs Review Labs Reviewed  CBC WITH DIFFERENTIAL/PLATELET  COMPREHENSIVE METABOLIC PANEL  PROTIME-INR    Imaging Review No results found. I have personally reviewed and evaluated these images and lab results as part of my medical decision-making.    MDM   Final diagnoses:  None    Patient with history of hepatocellular carcinoma who presents with complaints of abdominal distention. He has known ascites and was told to come here for a paracentesis. This was able to be performed this afternoon by radiology with ultrasound guidance. This yielded nearly 3 L of fluid. His laboratory studies are otherwise unremarkable and consistent with his baseline.  He reported at one point difficulty ambulating, however after some coaxing was able to ambulate independently without difficulty. He is continually asked for pain  medication while he has been here. This has been provided to a limited extent due to his somnolence and instability on his feet.  I feel as though he is appropriate for discharge. He is currently under the care of hospice.    Veryl Speak, MD 06/28/15 [phone]

## 2015-06-28 NOTE — Procedures (Signed)
Successful US guided paracentesis from RLQ.  Yielded 2.8 liters of light pale colored fluid.  No immediate complications.  Pt tolerated well.   Specimen was sent for labs.  Tsosie Billing D PA-C 06/28/2015 1:53 PM

## 2015-06-28 NOTE — ED Notes (Signed)
Bed: [id number] Expected date: 06/28/15 Expected time: 10:58 AM Means of arrival: Ambulance Comments: Ca pt, abd pain, Hospice

## 2015-07-03 LAB — BODY FLUID CULTURE: Culture: NO GROWTH

## 2015-07-15 ENCOUNTER — Ambulatory Visit: Payer: Self-pay | Admitting: Internal Medicine

## 2015-08-13 DEATH — deceased

## 2016-07-02 IMAGING — CR DG CHEST 2V
2 series · 2 of 2 positions shown · non-contrast
Comparison: 01/25/2014

CLINICAL DATA: Mid chest pain for 1 day.

EXAM:
CHEST  2 VIEW

[chest pa]
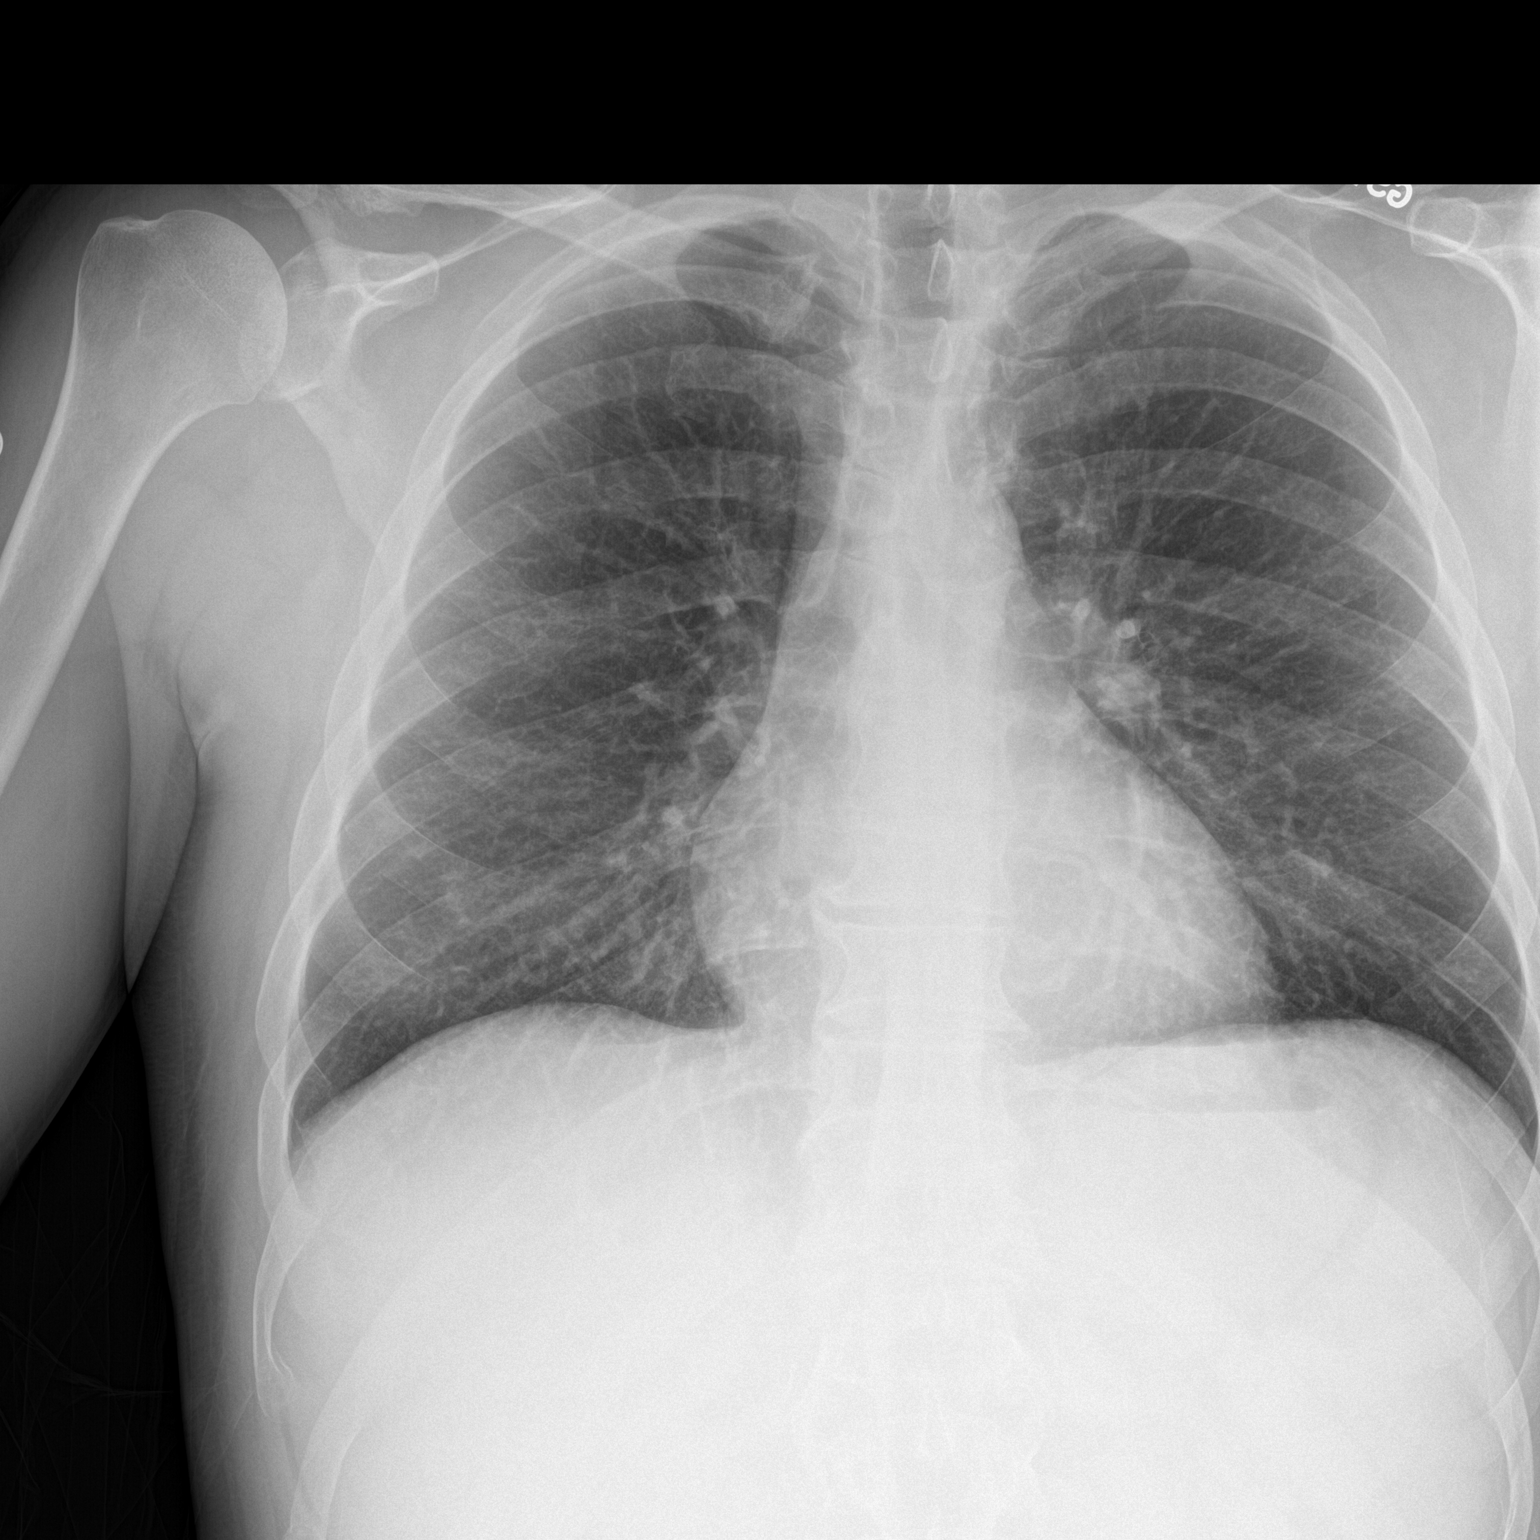

[chest lat]
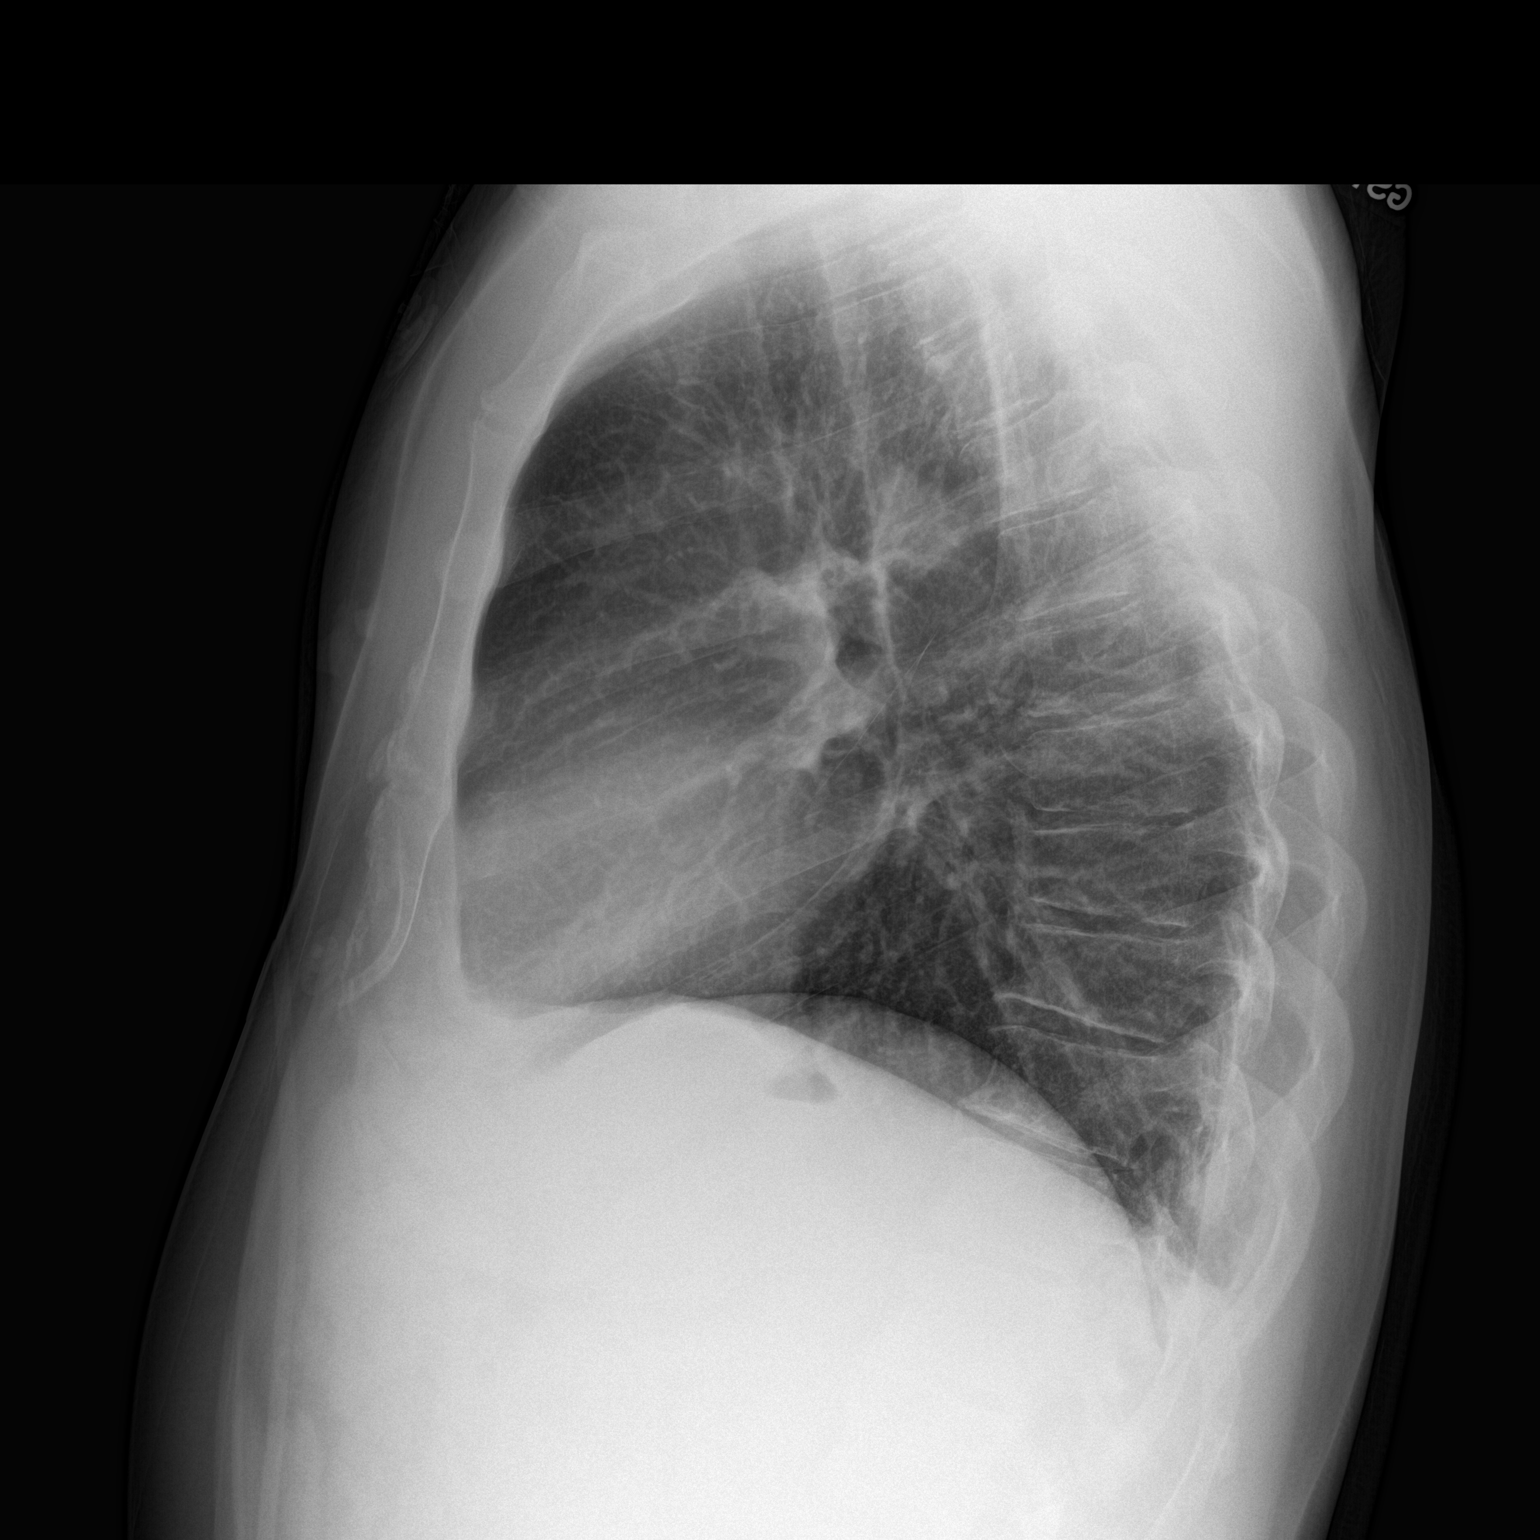

[2 of 2 positions shown; findings below may reference images not displayed]

FINDINGS: The cardiomediastinal contours are normal. Mild coarsening of
interstitial markings Pulmonary vasculature is normal. No
consolidation, pleural effusion, or pneumothorax. No acute osseous
abnormalities are seen.
IMPRESSION: No acute pulmonary process.
